# Patient Record
Sex: Male | Born: 1960 | Race: White | Hispanic: No | Marital: Married | Smoking: Former smoker
Health system: Southern US, Community
[De-identification: ages and names within clinical notes are randomized; demographics above are authoritative.]

## PROBLEM LIST (undated history)

## (undated) DIAGNOSIS — T4145XA Adverse effect of unspecified anesthetic, initial encounter: Secondary | ICD-10-CM

## (undated) DIAGNOSIS — K08109 Complete loss of teeth, unspecified cause, unspecified class: Secondary | ICD-10-CM

## (undated) DIAGNOSIS — M1990 Unspecified osteoarthritis, unspecified site: Secondary | ICD-10-CM

## (undated) DIAGNOSIS — K279 Peptic ulcer, site unspecified, unspecified as acute or chronic, without hemorrhage or perforation: Secondary | ICD-10-CM

## (undated) DIAGNOSIS — E079 Disorder of thyroid, unspecified: Secondary | ICD-10-CM

## (undated) DIAGNOSIS — K219 Gastro-esophageal reflux disease without esophagitis: Secondary | ICD-10-CM

## (undated) DIAGNOSIS — T8859XA Other complications of anesthesia, initial encounter: Secondary | ICD-10-CM

## (undated) DIAGNOSIS — Z973 Presence of spectacles and contact lenses: Secondary | ICD-10-CM

## (undated) DIAGNOSIS — R9431 Abnormal electrocardiogram [ECG] [EKG]: Secondary | ICD-10-CM

## (undated) DIAGNOSIS — R079 Chest pain, unspecified: Secondary | ICD-10-CM

## (undated) DIAGNOSIS — Z972 Presence of dental prosthetic device (complete) (partial): Secondary | ICD-10-CM

## (undated) DIAGNOSIS — S3992XA Unspecified injury of lower back, initial encounter: Secondary | ICD-10-CM

## (undated) DIAGNOSIS — G4730 Sleep apnea, unspecified: Secondary | ICD-10-CM

## (undated) HISTORY — PX: EYE SURGERY: SHX253

## (undated) HISTORY — PX: COLONOSCOPY WITH ESOPHAGOGASTRODUODENOSCOPY (EGD): SHX5779

## (undated) HISTORY — PX: TONSILLECTOMY: SUR1361

## (undated) HISTORY — PX: CARPAL TUNNEL RELEASE: SHX101

## (undated) HISTORY — DX: Gastro-esophageal reflux disease without esophagitis: K21.9

## (undated) HISTORY — DX: Sleep apnea, unspecified: G47.30

## (undated) HISTORY — DX: Unspecified injury of lower back, initial encounter: S39.92XA

## (undated) HISTORY — DX: Chest pain, unspecified: R07.9

## (undated) HISTORY — PX: JOINT REPLACEMENT: SHX530

## (undated) HISTORY — PX: UMBILICAL HERNIA REPAIR: SHX196

## (undated) HISTORY — DX: Disorder of thyroid, unspecified: E07.9

## (undated) HISTORY — PX: MULTIPLE TOOTH EXTRACTIONS: SHX2053

## (undated) HISTORY — DX: Peptic ulcer, site unspecified, unspecified as acute or chronic, without hemorrhage or perforation: K27.9

## (undated) HISTORY — PX: CYST REMOVAL NECK: SHX6281

---

## 2011-07-16 DIAGNOSIS — R079 Chest pain, unspecified: Secondary | ICD-10-CM

## 2016-04-03 ENCOUNTER — Encounter: Payer: Self-pay | Admitting: *Deleted

## 2016-04-04 ENCOUNTER — Encounter: Payer: Self-pay | Admitting: Cardiology

## 2016-04-04 ENCOUNTER — Ambulatory Visit (INDEPENDENT_AMBULATORY_CARE_PROVIDER_SITE_OTHER): Payer: BLUE CROSS/BLUE SHIELD | Admitting: Cardiology

## 2016-04-04 ENCOUNTER — Telehealth: Payer: Self-pay | Admitting: Cardiology

## 2016-04-04 ENCOUNTER — Encounter: Payer: Self-pay | Admitting: *Deleted

## 2016-04-04 VITALS — BP 111/75 | HR 72 | Ht 70.0 in | Wt 226.6 lb

## 2016-04-04 DIAGNOSIS — R079 Chest pain, unspecified: Secondary | ICD-10-CM

## 2016-04-04 LAB — PROTIME-INR

## 2016-04-04 MED ORDER — ISOSORBIDE MONONITRATE ER 30 MG PO TB24: 15.0000 mg | ORAL_TABLET | Freq: Every day | ORAL | 1 refills | Status: DC

## 2016-04-04 NOTE — Progress Notes (Signed)
Clinical Summary Mr. Taaffe is a 56 y.o.male seen as new patient, he referred by Dr Tawny Asal for the following medical problems.   1. Chest pain - episode of chest pain. Multiple episodes. Can occur at rest or with exertion. Left sided chest pressure, radiates down left arm. Lasts just a few minutes. Some increase in frequency, stable severity - admitted to [facility] 03/2016 with chest pain - Lexiscan showed inferior ischemia, intermediate risk   CAD risk factors: former x 42 years     SH [employer] is family member.  Past Medical History:  Diagnosis Date  . Chest pain   . Gastroesophageal reflux   . Lower back injury   . Peptic ulcer   . Sleep apnea   . Thyroid disease      Allergies no known allergies   Current Outpatient Prescriptions  Medication Sig Dispense Refill  . aspirin EC 81 MG tablet Take 81 mg by mouth daily.    Marland Kitchen atorvastatin (LIPITOR) 40 MG tablet Take 40 mg by mouth daily.    Marland Kitchen levothyroxine (SYNTHROID, LEVOTHROID) 50 MCG tablet Take 50 mcg by mouth daily before breakfast.    . metoprolol succinate (TOPROL-XL) 50 MG 24 hr tablet Take 50 mg by mouth daily. Take with or immediately following a meal.    . nitroGLYCERIN (NITROSTAT) 0.4 MG SL tablet Place 0.4 mg under the tongue every 5 (five) minutes as needed for chest pain.    . pantoprazole (PROTONIX) 40 MG tablet Take 40 mg by mouth daily.     No current facility-administered medications for this visit.      Past Surgical History:  Procedure Laterality Date  . CYST REMOVAL NECK       Allergies no known allergies    Family History  Problem Relation Age of Onset  . Liver cancer Mother      Social History Mr. Bohnet reports that he quit smoking about 4 years ago. His smoking use included Cigarettes. He does not have any smokeless tobacco history on file. Mr. Keelan has no alcohol history on file.   Review of Systems CONSTITUTIONAL: No weight loss, fever, chills, weakness  or fatigue.  HEENT: Eyes: No visual loss, blurred vision, double vision or yellow sclerae.No hearing loss, sneezing, congestion, runny nose or sore throat.  SKIN: No rash or itching.  CARDIOVASCULAR: per HPI RESPIRATORY: No shortness of breath, cough or sputum.  GASTROINTESTINAL: No anorexia, nausea, vomiting or diarrhea. No abdominal pain or blood.  GENITOURINARY: No burning on urination, no polyuria NEUROLOGICAL: No headache, dizziness, syncope, paralysis, ataxia, numbness or tingling in the extremities. No change in bowel or bladder control.  MUSCULOSKELETAL: No muscle, back pain, joint pain or stiffness.  LYMPHATICS: No enlarged nodes. No history of splenectomy.  PSYCHIATRIC: No history of depression or anxiety.  ENDOCRINOLOGIC: No reports of sweating, cold or heat intolerance. No polyuria or polydipsia.  Marland Kitchen   Physical Examination Vitals:   04/04/16 0823 04/04/16 0831  BP: 119/80 111/75  Pulse: 77 72   Vitals:   04/04/16 0823  Weight: 226 lb 9.6 oz (102.8 kg)  Height: 5\' 10"  (1.778 m)    Gen: resting comfortably, no acute distress HEENT: no scleral icterus, pupils equal round and reactive, no palptable cervical adenopathy,  CV: RRR, no m/r/g, no jvd Resp: Clear to auscultation bilaterally GI: abdomen is soft, non-tender, non-distended, normal bowel sounds, no hepatosplenomegaly MSK: extremities are warm, no edema.  Skin: warm, no rash Neuro:  no focal  deficits Psych: appropriate affect      Assessment and Plan  1. Chest pain - recent symptoms with abnormal lexiscan - we will plan for left heart cath to further evaluate - start imdur 15mg  daily as additional antianginal over the weekend. Counseled if significant symptoms over the weekend to present to ER.  I have reviewed the risks, indications, and alternatives to cardiac catheterization, possible angioplasty, and stenting with the patient and his wife today. Risks include but are not limited to bleeding, infection,  vascular injury, stroke, myocardial infection, arrhythmia, kidney injury, radiation-related injury in the case of prolonged fluoroscopy use, emergency cardiac surgery, and death. The patient understands the risks of serious complication is 1-2 in 6834 with diagnostic cardiac cath and 1-2% or less with angioplasty/stenting.       Arnoldo Lenis, M.D.

## 2016-04-04 NOTE — Telephone Encounter (Signed)
[facility] 04/07/16 DR KELLY @ 7:30AM

## 2016-04-04 NOTE — Patient Instructions (Signed)
Your physician recommends that you schedule a follow-up appointment in: [name]  Your physician has recommended you make the following change in your medication:   START IMDUR 15 MG (1/2 TABLET) DAILY  Your physician recommends that you return for lab work TODAY CBC/BMP/PT - [facility] [place] physician has requested that you have a cardiac catheterization. Cardiac catheterization is used to diagnose and/or treat various heart conditions. Doctors may recommend this procedure for a number of different reasons. The most common reason is to evaluate chest pain. Chest pain can be a symptom of coronary artery disease (CAD), and cardiac catheterization can show whether plaque is narrowing or blocking your heart's arteries. This procedure is also used to evaluate the valves, as well as measure the blood flow and oxygen levels in different parts of your heart. For further information please visit [email or web address]. Please follow instruction sheet, as given.  Thank you for choosing [facility]!!

## 2016-04-07 ENCOUNTER — Encounter (HOSPITAL_COMMUNITY): Payer: Self-pay | Admitting: Cardiovascular Disease

## 2016-04-07 ENCOUNTER — Other Ambulatory Visit: Payer: Self-pay

## 2016-04-07 ENCOUNTER — Ambulatory Visit (HOSPITAL_COMMUNITY)
Admission: RE | Disposition: A | Discharge status: Home / Self Care | Payer: Self-pay | Source: Ambulatory Visit | Attending: Cardiovascular Disease

## 2016-04-07 ENCOUNTER — Other Ambulatory Visit: Payer: Self-pay | Admitting: Cardiology

## 2016-04-07 ENCOUNTER — Ambulatory Visit (HOSPITAL_COMMUNITY)
Admission: RE | Admit: 2016-04-07 | Discharge: 2016-04-07 | Disposition: A | Discharge status: Home / Self Care | Payer: BLUE CROSS/BLUE SHIELD | Source: Ambulatory Visit | Attending: Cardiovascular Disease | Admitting: Cardiovascular Disease

## 2016-04-07 ENCOUNTER — Telehealth: Payer: Self-pay | Admitting: Cardiology

## 2016-04-07 DIAGNOSIS — R51 Headache: Secondary | ICD-10-CM | POA: Diagnosis not present

## 2016-04-07 DIAGNOSIS — G4730 Sleep apnea, unspecified: Secondary | ICD-10-CM | POA: Insufficient documentation

## 2016-04-07 DIAGNOSIS — Z8711 Personal history of peptic ulcer disease: Secondary | ICD-10-CM | POA: Insufficient documentation

## 2016-04-07 DIAGNOSIS — R9439 Abnormal result of other cardiovascular function study: Secondary | ICD-10-CM | POA: Diagnosis present

## 2016-04-07 DIAGNOSIS — K219 Gastro-esophageal reflux disease without esophagitis: Secondary | ICD-10-CM | POA: Diagnosis not present

## 2016-04-07 DIAGNOSIS — R0789 Other chest pain: Secondary | ICD-10-CM | POA: Diagnosis not present

## 2016-04-07 DIAGNOSIS — R079 Chest pain, unspecified: Secondary | ICD-10-CM | POA: Insufficient documentation

## 2016-04-07 DIAGNOSIS — E079 Disorder of thyroid, unspecified: Secondary | ICD-10-CM | POA: Insufficient documentation

## 2016-04-07 DIAGNOSIS — Z7982 Long term (current) use of aspirin: Secondary | ICD-10-CM | POA: Insufficient documentation

## 2016-04-07 DIAGNOSIS — Z87891 Personal history of nicotine dependence: Secondary | ICD-10-CM | POA: Diagnosis not present

## 2016-04-07 HISTORY — PX: LEFT HEART CATH AND CORONARY ANGIOGRAPHY: CATH118249

## 2016-04-07 SURGERY — LEFT HEART CATH AND CORONARY ANGIOGRAPHY
Anesthesia: LOCAL

## 2016-04-07 MED ORDER — SODIUM CHLORIDE 0.9 % WEIGHT BASED INFUSION: 1.0000 mL/kg/h | INTRAVENOUS | Status: DC

## 2016-04-07 MED ORDER — VERAPAMIL HCL 2.5 MG/ML IV SOLN
INTRAVENOUS | Status: DC | PRN
  Administered 2016-04-07: 10 mL via INTRA_ARTERIAL

## 2016-04-07 MED ORDER — SODIUM CHLORIDE 0.9% FLUSH: 3.0000 mL | INTRAVENOUS | Status: DC | PRN

## 2016-04-07 MED ORDER — FENTANYL CITRATE (PF) 100 MCG/2ML IJ SOLN
INTRAMUSCULAR | Status: AC
  Filled 2016-04-07: qty 2

## 2016-04-07 MED ORDER — HEPARIN (PORCINE) IN NACL 2-0.9 UNIT/ML-% IJ SOLN
INTRAMUSCULAR | Status: AC
  Filled 2016-04-07: qty 1000

## 2016-04-07 MED ORDER — IOPAMIDOL (ISOVUE-370) INJECTION 76%
INTRAVENOUS | Status: AC
  Filled 2016-04-07: qty 100

## 2016-04-07 MED ORDER — LIDOCAINE HCL (PF) 1 % IJ SOLN
INTRAMUSCULAR | Status: DC | PRN
  Administered 2016-04-07: 2 mL

## 2016-04-07 MED ORDER — SODIUM CHLORIDE 0.9 % IV SOLN: INTRAVENOUS | Status: AC

## 2016-04-07 MED ORDER — ONDANSETRON HCL 4 MG/2ML IJ SOLN
INTRAMUSCULAR | Status: DC | PRN
  Administered 2016-04-07: 4 mg via INTRAVENOUS

## 2016-04-07 MED ORDER — IOPAMIDOL (ISOVUE-370) INJECTION 76%
INTRAVENOUS | Status: DC | PRN
  Administered 2016-04-07: 65 mL via INTRAVENOUS

## 2016-04-07 MED ORDER — TRAMADOL HCL 50 MG PO TABS
50.0000 mg | ORAL_TABLET | Freq: Two times a day (BID) | ORAL | Status: DC | PRN
  Administered 2016-04-07: 50 mg via ORAL

## 2016-04-07 MED ORDER — LIDOCAINE HCL (PF) 1 % IJ SOLN
INTRAMUSCULAR | Status: AC
  Filled 2016-04-07: qty 30

## 2016-04-07 MED ORDER — FENTANYL CITRATE (PF) 100 MCG/2ML IJ SOLN
INTRAMUSCULAR | Status: DC | PRN
  Administered 2016-04-07 (×2): 25 ug via INTRAVENOUS

## 2016-04-07 MED ORDER — ASPIRIN 81 MG PO CHEW
81.0000 mg | CHEWABLE_TABLET | ORAL | Status: AC
  Administered 2016-04-07: 81 mg via ORAL

## 2016-04-07 MED ORDER — VERAPAMIL HCL 2.5 MG/ML IV SOLN
INTRAVENOUS | Status: AC
  Filled 2016-04-07: qty 2

## 2016-04-07 MED ORDER — ONDANSETRON HCL 4 MG/2ML IJ SOLN: 4.0000 mg | Freq: Four times a day (QID) | INTRAMUSCULAR | Status: DC | PRN

## 2016-04-07 MED ORDER — ASPIRIN 81 MG PO CHEW
CHEWABLE_TABLET | ORAL | Status: AC
  Administered 2016-04-07: 81 mg via ORAL
  Filled 2016-04-07: qty 1

## 2016-04-07 MED ORDER — TRAMADOL HCL 50 MG PO TABS
ORAL_TABLET | ORAL | Status: AC
  Administered 2016-04-07: 50 mg via ORAL
  Filled 2016-04-07: qty 1

## 2016-04-07 MED ORDER — ONDANSETRON HCL 4 MG/2ML IJ SOLN
INTRAMUSCULAR | Status: AC
  Filled 2016-04-07: qty 2

## 2016-04-07 MED ORDER — ACETAMINOPHEN 325 MG PO TABS
ORAL_TABLET | ORAL | Status: AC
  Filled 2016-04-07: qty 2

## 2016-04-07 MED ORDER — SODIUM CHLORIDE 0.9% FLUSH: 3.0000 mL | Freq: Two times a day (BID) | INTRAVENOUS | Status: DC

## 2016-04-07 MED ORDER — ACETAMINOPHEN 325 MG PO TABS
650.0000 mg | ORAL_TABLET | ORAL | Status: DC | PRN
  Administered 2016-04-07: 650 mg via ORAL

## 2016-04-07 MED ORDER — MIDAZOLAM HCL 2 MG/2ML IJ SOLN
INTRAMUSCULAR | Status: AC
  Filled 2016-04-07: qty 2

## 2016-04-07 MED ORDER — HEPARIN SODIUM (PORCINE) 1000 UNIT/ML IJ SOLN
INTRAMUSCULAR | Status: AC
  Filled 2016-04-07: qty 1

## 2016-04-07 MED ORDER — MIDAZOLAM HCL 2 MG/2ML IJ SOLN
INTRAMUSCULAR | Status: DC | PRN
  Administered 2016-04-07: 1 mg via INTRAVENOUS
  Administered 2016-04-07: 2 mg via INTRAVENOUS

## 2016-04-07 MED ORDER — SODIUM CHLORIDE 0.9 % IV SOLN: 250.0000 mL | INTRAVENOUS | Status: DC | PRN

## 2016-04-07 MED ORDER — SODIUM CHLORIDE 0.9 % WEIGHT BASED INFUSION
3.0000 mL/kg/h | INTRAVENOUS | Status: AC
  Administered 2016-04-07: 3 mL/kg/h via INTRAVENOUS

## 2016-04-07 MED ORDER — HEPARIN SODIUM (PORCINE) 1000 UNIT/ML IJ SOLN
INTRAMUSCULAR | Status: DC | PRN
  Administered 2016-04-07: 5000 [IU] via INTRAVENOUS

## 2016-04-07 SURGICAL SUPPLY — 12 items
CATH EXPO 5FR ANG PIGTAIL 145 (CATHETERS) ×2 IMPLANT
CATH EXPO 5FR FR4 (CATHETERS) ×2 IMPLANT
CATH OPTITORQUE TIG 4.0 5F (CATHETERS) ×2 IMPLANT
DEVICE RAD COMP TR BAND LRG (VASCULAR PRODUCTS) ×2 IMPLANT
GLIDESHEATH SLEND SS 6F .021 (SHEATH) ×2 IMPLANT
GUIDEWIRE INQWIRE 1.5J.035X260 (WIRE) ×1 IMPLANT
INQWIRE 1.5J .035X260CM (WIRE) ×2
KIT HEART LEFT (KITS) ×2 IMPLANT
PACK CARDIAC CATHETERIZATION (CUSTOM PROCEDURE TRAY) ×2 IMPLANT
SYR MEDRAD MARK V 150ML (SYRINGE) ×2 IMPLANT
TRANSDUCER W/STOPCOCK (MISCELLANEOUS) ×2 IMPLANT
TUBING CIL FLEX 10 FLL-RA (TUBING) ×2 IMPLANT

## 2016-04-07 NOTE — Progress Notes (Signed)
Dr Claiborne Billings notified of client c/o 9/10 headache and will give tylenol as ordered per Dr Claiborne Billings

## 2016-04-07 NOTE — Progress Notes (Signed)
C/O nausea with headache and Dr Claiborne Billings aware

## 2016-04-07 NOTE — Discharge Instructions (Signed)
Radial Site Care °Refer to this sheet in the next few weeks. These instructions provide you with information about caring for yourself after your procedure. Your health care provider may also give you more specific instructions. Your treatment has been planned according to current medical practices, but problems sometimes occur. Call your health care provider if you have any problems or questions after your procedure. °What can I expect after the procedure? °After your procedure, it is typical to have the following: °· Bruising at the radial site that usually fades within 1-2 weeks. °· Blood collecting in the tissue (hematoma) that may be painful to the touch. It should usually decrease in size and tenderness within 1-2 weeks. °Follow these instructions at home: °· Take medicines only as directed by your health care provider. °· You may shower 24-48 hours after the procedure or as directed by your health care provider. Remove the bandage (dressing) and gently wash the site with plain soap and water. Pat the area dry with a clean towel. Do not rub the site, because this may cause bleeding. °· Do not take baths, swim, or use a hot tub until your health care provider approves. °· Check your insertion site every day for redness, swelling, or drainage. °· Do not apply powder or lotion to the site. °· Do not flex or bend the affected arm for 24 hours or as directed by your health care provider. °· Do not push or pull heavy objects with the affected arm for 24 hours or as directed by your health care provider. °· Do not lift over 10 lb (4.5 kg) for 5 days after your procedure or as directed by your health care provider. °· Ask your health care provider when it is okay to: °¨ Return to work or school. °¨ Resume usual physical activities or sports. °¨ Resume sexual activity. °· Do not drive home if you are discharged the same day as the procedure. Have someone else drive you. °· You may drive 24 hours after the procedure  unless otherwise instructed by your health care provider. °· Do not operate machinery or power tools for 24 hours after the procedure. °· If your procedure was done as an outpatient procedure, which means that you went home the same day as your procedure, a responsible adult should be with you for the first 24 hours after you arrive home. °· Keep all follow-up visits as directed by your health care provider. This is important. °Contact a health care provider if: °· You have a fever. °· You have chills. °· You have increased bleeding from the radial site. Hold pressure on the site. °Get help right away if: °· You have unusual pain at the radial site. °· You have redness, warmth, or swelling at the radial site. °· You have drainage (other than a small amount of blood on the dressing) from the radial site. °· The radial site is bleeding, and the bleeding does not stop after 30 minutes of holding steady pressure on the site. °· Your arm or hand becomes pale, cool, tingly, or numb. °This information is not intended to replace advice given to you by your health care provider. Make sure you discuss any questions you have with your health care provider. °Document Released: 02/01/2010 Document Revised: 06/07/2015 Document Reviewed: 07/18/2013 °Elsevier Interactive Patient Education © 2017 [employer] ° °

## 2016-04-07 NOTE — Progress Notes (Addendum)
Patient has severe headache since on Imdur. Here as well did not improved with Tylenol x 3. Will try Tramadol. Cath showed  essentially normal coronary arteries with very minimal luminal irregularity of the LAD without obstructive disease. Discontinue Imdur.

## 2016-04-07 NOTE — Interval H&P Note (Signed)
Cath Lab Visit (complete for each Cath Lab visit)  Clinical Evaluation Leading to the Procedure:   ACS: No.  Non-ACS:    Anginal Classification: CCS III  Anti-ischemic medical therapy: Maximal Therapy (2 or more classes of medications)  Non-Invasive Test Results: Intermediate-risk stress test findings: cardiac mortality 1-3%/year  Prior CABG: No previous CABG      History and Physical Interval Note:  04/07/2016 7:50 AM  Travis Evans  has presented today for surgery, with the diagnosis of cp  The various methods of treatment have been discussed with the patient and family. After consideration of risks, benefits and other options for treatment, the patient has consented to  Procedure(s): Left Heart Cath and Coronary Angiography (N/A) as a surgical intervention .  The patient's history has been reviewed, patient examined, no change in status, stable for surgery.  I have reviewed the patient's chart and labs.  Questions were answered to the patient's satisfaction.     Shelva Majestic

## 2016-04-07 NOTE — Telephone Encounter (Signed)
Cath reviewed, looks good. Stress tests can sometimes be wrong, it happens around 10% of the time. He can stop his imdur since he is having headaches. Ok to stop cholesterol medicine at this time, though we don't actually have a cholesterol on file for him and likely we will need to readdress this in the near future. I would suggest staying on aspirin. Please request labs from pcp. He should f/u with pcp to discuss noncardiac causes of chest pain. F/u with [place] postcath in 3-4 weeks, we can discuss the rest of his medications at that time.    Zandra Abts MD

## 2016-04-07 NOTE — Telephone Encounter (Signed)
Wife is very concerned with his Cath showing no blockages and his  possibility of still having heart attack  Has questions about medications and if he really needs to be on them since Cath showed no blockages

## 2016-04-07 NOTE — Telephone Encounter (Signed)
Spoke with patients wife she was concerned with patient taking imdur due to it giving him a headache. Spoke with Dr doing the cath today and he stated patient could stop taking it but wife wanted to follow up with Dr. Harl Bowie. Also wanting to know if he could stop taking his cholesterol medicine because he has never had high cholesterol.

## 2016-04-07 NOTE — H&P (Signed)
Updated H&P: See Dr. Nelly Laurence note from 04/04/16. He is scheduled for for cath and possible PCI per Dr. Harl Bowie. He is aware of risk/benefits of procedure. Shelva Majestic, MD  7:49 AM  04/07/2016

## 2016-04-07 NOTE — Progress Notes (Signed)
Client's wife requesting to speak to Dr Claiborne Billings again and Dr Claiborne Billings notified

## 2016-04-08 MED FILL — Heparin Sodium (Porcine) 2 Unit/ML in Sodium Chloride 0.9%: INTRAMUSCULAR | Qty: 500 | Status: AC

## 2016-04-23 ENCOUNTER — Ambulatory Visit: Payer: BLUE CROSS/BLUE SHIELD | Admitting: Cardiology

## 2016-04-23 ENCOUNTER — Encounter: Payer: Self-pay | Admitting: Cardiology

## 2016-04-23 NOTE — Progress Notes (Deleted)
Clinical Summary Travis Evans is a 56 y.o.male  1. Chest pain - episode of chest pain. Multiple episodes. Can occur at rest or with exertion. Left sided chest pressure, radiates down left arm. Lasts just a few minutes. Some increase in frequency, stable severity - admitted to [facility] 03/2016 with chest pain - Lexiscan showed inferior ischemia, intermediate risk   03/2016 cath without significant CAD - he is off imdur.   2. HL?   3.   CAD risk factors: former x 42 years Past Medical History:  Diagnosis Date  . Chest pain   . Gastroesophageal reflux   . Lower back injury   . Peptic ulcer   . Sleep apnea   . Thyroid disease      No Known Allergies   Current Outpatient Prescriptions  Medication Sig Dispense Refill  . aspirin EC 81 MG tablet Take 81 mg by mouth at bedtime.     Marland Kitchen azelastine (ASTELIN) 0.1 % nasal spray Place 1 spray into both nostrils daily. Use in each nostril as directed    . cyclobenzaprine (FLEXERIL) 10 MG tablet Take 10 mg by mouth 3 (three) times daily as needed for muscle spasms.    Marland Kitchen doxycycline (VIBRAMYCIN) 100 MG capsule Take 100 mg by mouth 2 (two) times daily.    Marland Kitchen EPINEPHrine (EPIPEN 2-PAK) 0.3 mg/0.3 mL IJ SOAJ injection Inject 0.3 mg into the muscle once as needed (allergic reaction).    Marland Kitchen ibuprofen (ADVIL,MOTRIN) 200 MG tablet Take 400-600 mg by mouth every 6 (six) hours as needed for moderate pain.    Marland Kitchen levothyroxine (SYNTHROID, LEVOTHROID) 50 MCG tablet Take 50 mcg by mouth at bedtime.     Marland Kitchen loratadine (CLARITIN) 10 MG tablet Take 10 mg by mouth at bedtime.     . metoprolol succinate (TOPROL-XL) 50 MG 24 hr tablet Take 50 mg by mouth at bedtime. Take with or immediately following a meal.     . Multiple Vitamin (MULTIVITAMIN WITH MINERALS) TABS tablet Take 1 tablet by mouth at bedtime.     . nitroGLYCERIN (NITROSTAT) 0.4 MG SL tablet Place 0.4 mg under the tongue every 5 (five) minutes as needed for chest pain.    . pantoprazole  (PROTONIX) 40 MG tablet Take 40 mg by mouth at bedtime.     . vitamin C (ASCORBIC ACID) 500 MG tablet Take 500 mg by mouth 2 (two) times daily.      No current facility-administered medications for this visit.      Past Surgical History:  Procedure Laterality Date  . CYST REMOVAL NECK    . LEFT HEART CATH AND CORONARY ANGIOGRAPHY N/A 04/07/2016   Procedure: Left Heart Cath and Coronary Angiography;  Surgeon: Troy Sine, MD;  Location: [place] [facility];  Service: Cardiovascular;  Laterality: N/A;     No Known Allergies    Family History  Problem Relation Age of Onset  . Liver cancer Mother   . Stroke Father      Social History Mr. Muellner reports that he quit smoking about 4 years ago. His smoking use included Cigarettes. He smoked 3.00 packs per day. He has never used smokeless tobacco. Mr. Kant has no alcohol history on file.   Review of Systems CONSTITUTIONAL: No weight loss, fever, chills, weakness or fatigue.  HEENT: Eyes: No visual loss, blurred vision, double vision or yellow sclerae.No hearing loss, sneezing, congestion, runny nose or sore throat.  SKIN: No rash or itching.  CARDIOVASCULAR:  RESPIRATORY: No shortness of breath, cough or sputum.  GASTROINTESTINAL: No anorexia, nausea, vomiting or diarrhea. No abdominal pain or blood.  GENITOURINARY: No burning on urination, no polyuria NEUROLOGICAL: No headache, dizziness, syncope, paralysis, ataxia, numbness or tingling in the extremities. No change in bowel or bladder control.  MUSCULOSKELETAL: No muscle, back pain, joint pain or stiffness.  LYMPHATICS: No enlarged nodes. No history of splenectomy.  PSYCHIATRIC: No history of depression or anxiety.  ENDOCRINOLOGIC: No reports of sweating, cold or heat intolerance. No polyuria or polydipsia.  Marland Kitchen   Physical Examination There were no vitals filed for this visit. There were no vitals filed for this visit.  Gen: resting comfortably, no acute distress HEENT:  no scleral icterus, pupils equal round and reactive, no palptable cervical adenopathy,  CV Resp: Clear to auscultation bilaterally GI: abdomen is soft, non-tender, non-distended, normal bowel sounds, no hepatosplenomegaly MSK: extremities are warm, no edema.  Skin: warm, no rash Neuro:  no focal deficits Psych: appropriate affect   Diagnostic Studies  03/2016 cath  The left ventricular systolic function is normal.  LV end diastolic pressure is normal.   Normal LV function.  Essentially normal coronary arteries with very minimal luminal irregularity of the LAD without obstructive disease.  RECOMMENDATION: Medical therapy.  Consider evaluation for noncardiac etiology to the patient's chest pain vs small vessel disease. Suspect the nuclear study may be artifactual.    Assessment and Plan  1. Chest pain - recent symptoms with abnormal lexiscan - we will plan for left heart cath to further evaluate - start imdur 15mg  daily as additional antianginal over the weekend. Counseled if significant symptoms over the weekend to present to ER.  I have reviewed the risks, indications, and alternatives to cardiac catheterization, possible angioplasty, and stenting with the patient and his wife today. Risks include but are not limited to bleeding, infection, vascular injury, stroke, myocardial infection, arrhythmia, kidney injury, radiation-related injury in the case of prolonged fluoroscopy use, emergency cardiac surgery, and death. The patient understands the risks of serious complication is 1-2 in 1610 with diagnostic cardiac cath and 1-2% or less with angioplasty/stenting.        Arnoldo Lenis, M.D., F.A.C.C.

## 2016-06-10 ENCOUNTER — Telehealth: Payer: Self-pay | Admitting: Cardiology

## 2016-06-10 NOTE — Telephone Encounter (Signed)
Patient calling to see if clearance has been given for his surgery

## 2016-06-10 NOTE — Telephone Encounter (Signed)
Pt says having back surgery L3,4, and 5 at [place] - says they are faxing over form for Dr Harl Bowie to sign for cardiac clearance. Verified our fax number with pt

## 2016-06-12 NOTE — Telephone Encounter (Signed)
I have given signed consents to nursing who will send today   Zandra Abts MD

## 2016-06-12 NOTE — Telephone Encounter (Signed)
Pt made aware and appreciative. 

## 2016-06-12 NOTE — Telephone Encounter (Signed)
Clearance faxed to [place] office 2 days ago for Dr Harl Bowie

## 2016-06-12 NOTE — Telephone Encounter (Signed)
Pt called again regarding if ok for surgery and if ok to stop ASA

## 2016-10-22 ENCOUNTER — Other Ambulatory Visit: Payer: Self-pay | Admitting: Orthopedic Surgery

## 2016-10-22 DIAGNOSIS — M545 Low back pain, unspecified: Secondary | ICD-10-CM

## 2016-11-04 ENCOUNTER — Other Ambulatory Visit: Payer: BLUE CROSS/BLUE SHIELD

## 2016-11-07 ENCOUNTER — Ambulatory Visit
Admission: RE | Admit: 2016-11-07 | Discharge: 2016-11-07 | Disposition: A | Discharge status: Home / Self Care | Payer: BLUE CROSS/BLUE SHIELD | Source: Ambulatory Visit | Attending: Orthopedic Surgery | Admitting: Orthopedic Surgery

## 2016-11-07 DIAGNOSIS — M545 Low back pain, unspecified: Secondary | ICD-10-CM

## 2017-01-02 ENCOUNTER — Other Ambulatory Visit: Payer: Self-pay | Admitting: Orthopedic Surgery

## 2017-01-02 DIAGNOSIS — M25552 Pain in left hip: Secondary | ICD-10-CM

## 2017-01-09 ENCOUNTER — Other Ambulatory Visit: Payer: Self-pay

## 2017-01-09 ENCOUNTER — Encounter: Payer: Self-pay | Admitting: Cardiology

## 2017-01-09 ENCOUNTER — Ambulatory Visit (INDEPENDENT_AMBULATORY_CARE_PROVIDER_SITE_OTHER): Payer: BLUE CROSS/BLUE SHIELD | Admitting: Cardiology

## 2017-01-09 VITALS — BP 125/77 | HR 66 | Ht 70.0 in | Wt 231.0 lb

## 2017-01-09 DIAGNOSIS — R0789 Other chest pain: Secondary | ICD-10-CM

## 2017-01-09 DIAGNOSIS — E669 Obesity, unspecified: Secondary | ICD-10-CM

## 2017-01-09 NOTE — Patient Instructions (Signed)
Your physician recommends that you schedule a follow-up appointment in: [place]  Your physician recommends that you continue on your current medications as directed. Please refer to the Current Medication list given to you today.  You have been referred to NUTRITION   Thank you for choosing [facility]!!

## 2017-01-09 NOTE — Progress Notes (Signed)
Clinical Summary Travis Evans is a 56 y.o.male seen today for follow up of the following medical problems.    1. Chest pain  - episode of chest pain. Multiple episodes. Can occur at rest or with exertion. Left sided chest pressure, radiates down left arm. Lasts just a few minutes. Some increase in frequency, stable severity - admitted to [facility] 03/2016 with chest pain - Lexiscan showed inferior ischemia, intermediate risk  - 03/2016 cath without significant disease - denies any chest pain since last visit. No recent SOB/DOE      [place] is family member.    Past Medical History:  Diagnosis Date  . Chest pain   . Gastroesophageal reflux   . Lower back injury   . Peptic ulcer   . Sleep apnea   . Thyroid disease      No Known Allergies   Current Outpatient Medications  Medication Sig Dispense Refill  . aspirin EC 81 MG tablet Take 81 mg by mouth at bedtime.     Marland Kitchen azelastine (ASTELIN) 0.1 % nasal spray Place 1 spray into both nostrils daily. Use in each nostril as directed    . cyclobenzaprine (FLEXERIL) 10 MG tablet Take 10 mg by mouth 3 (three) times daily as needed for muscle spasms.    Marland Kitchen doxycycline (VIBRAMYCIN) 100 MG capsule Take 100 mg by mouth 2 (two) times daily.    Marland Kitchen EPINEPHrine (EPIPEN 2-PAK) 0.3 mg/0.3 mL IJ SOAJ injection Inject 0.3 mg into the muscle once as needed (allergic reaction).    Marland Kitchen ibuprofen (ADVIL,MOTRIN) 200 MG tablet Take 400-600 mg by mouth every 6 (six) hours as needed for moderate pain.    Marland Kitchen levothyroxine (SYNTHROID, LEVOTHROID) 50 MCG tablet Take 50 mcg by mouth at bedtime.     Marland Kitchen loratadine (CLARITIN) 10 MG tablet Take 10 mg by mouth at bedtime.     . metoprolol succinate (TOPROL-XL) 50 MG 24 hr tablet Take 50 mg by mouth at bedtime. Take with or immediately following a meal.     . Multiple Vitamin (MULTIVITAMIN WITH MINERALS) TABS tablet Take 1 tablet by mouth at bedtime.     . nitroGLYCERIN (NITROSTAT) 0.4 MG SL  tablet Place 0.4 mg under the tongue every 5 (five) minutes as needed for chest pain.    . pantoprazole (PROTONIX) 40 MG tablet Take 40 mg by mouth at bedtime.     . vitamin C (ASCORBIC ACID) 500 MG tablet Take 500 mg by mouth 2 (two) times daily.      No current facility-administered medications for this visit.         No Known Allergies    Family History  Problem Relation Age of Onset  . Liver cancer Mother   . Stroke Father      Social History Travis Evans reports that he quit smoking about 4 years ago. His smoking use included cigarettes. He smoked 3.00 packs per day. he has never used smokeless tobacco. Travis Evans has no alcohol history on file.   Review of Systems CONSTITUTIONAL: No weight loss, fever, chills, weakness or fatigue.  HEENT: Eyes: No visual loss, blurred vision, double vision or yellow sclerae.No hearing loss, sneezing, congestion, runny nose or sore throat.  SKIN: No rash or itching.  CARDIOVASCULAR:per hpi  RESPIRATORY: No shortness of breath, cough or sputum.  GASTROINTESTINAL: No anorexia, nausea, vomiting or diarrhea. No abdominal pain or blood.  GENITOURINARY: No burning on urination, no polyuria NEUROLOGICAL: No headache, dizziness,  syncope, paralysis, ataxia, numbness or tingling in the extremities. No change in bowel or bladder control.  MUSCULOSKELETAL: No muscle, back pain, joint pain or stiffness.  LYMPHATICS: No enlarged nodes. No history of splenectomy.  PSYCHIATRIC: No history of depression or anxiety.  ENDOCRINOLOGIC: No reports of sweating, cold or heat intolerance. No polyuria or polydipsia.  Marland Kitchen   Physical Examination Vitals:   01/09/17 1429  BP: 125/77  Pulse: 66  SpO2: 98%   Vitals:   01/09/17 1429  Weight: 231 lb (104.8 kg)  Height: 5\' 10"  (1.778 m)    Gen: resting comfortably, no acute distress HEENT: no scleral icterus, pupils equal round and reactive, no palptable cervical adenopathy,  CV: RRR, no m/r/g, no jvd Resp:  Clear to auscultation bilaterally GI: abdomen is soft, non-tender, non-distended, normal bowel sounds, no hepatosplenomegaly MSK: extremities are warm, no edema.  Skin: warm, no rash Neuro:  no focal deficits Psych: appropriate affect   Diagnostic Studies  03/2016 cath  The left ventricular systolic function is normal.  LV end diastolic pressure is normal.   Normal LV function.  Essentially normal coronary arteries with very minimal luminal irregularity of the LAD without obstructive disease.  RECOMMENDATION: Medical therapy.  Consider evaluation for noncardiac etiology to the patient's chest pain vs small vessel disease. Suspect the nuclear study may be artifactual.    Assessment and Plan   1. Chest pain - abnormal stress test but normal cath - no recent symptoms - no further cardiac testing at this time, continue risk factor modifciation.   2. Obesity -refer to nutritoin,   F/u as needed   Arnoldo Lenis, M.D.

## 2017-01-16 ENCOUNTER — Ambulatory Visit
Admission: RE | Admit: 2017-01-16 | Discharge: 2017-01-16 | Disposition: A | Discharge status: Home / Self Care | Payer: BLUE CROSS/BLUE SHIELD | Source: Ambulatory Visit | Attending: Orthopedic Surgery | Admitting: Orthopedic Surgery

## 2017-01-16 DIAGNOSIS — M25552 Pain in left hip: Secondary | ICD-10-CM

## 2017-01-16 MED ORDER — METHYLPREDNISOLONE ACETATE 40 MG/ML INJ SUSP (RADIOLOG
120.0000 mg | Freq: Once | INTRAMUSCULAR | Status: AC
  Administered 2017-01-16: 120 mg via INTRA_ARTICULAR

## 2017-01-16 MED ORDER — IOPAMIDOL (ISOVUE-M 200) INJECTION 41%
1.0000 mL | Freq: Once | INTRAMUSCULAR | Status: AC
  Administered 2017-01-16: 1 mL via INTRA_ARTICULAR

## 2017-01-16 NOTE — Discharge Instructions (Signed)

## 2017-01-17 ENCOUNTER — Encounter: Payer: Self-pay | Admitting: Cardiology

## 2017-01-20 ENCOUNTER — Other Ambulatory Visit: Payer: Self-pay | Admitting: Orthopaedic Surgery

## 2017-01-21 ENCOUNTER — Other Ambulatory Visit: Payer: BLUE CROSS/BLUE SHIELD

## 2017-01-26 ENCOUNTER — Other Ambulatory Visit: Payer: Self-pay | Admitting: Orthopaedic Surgery

## 2017-01-26 NOTE — Pre-Procedure Instructions (Signed)
    Travis Evans  01/26/2017      [id number] [place], [place] - [address] [place] [address] Phone: [phone] Fax: [phone]    Your procedure is scheduled on Tuesday, February 03, 2017  Report to [facility] Admitting at 8:15 A.M.  Call this number if you have problems the morning of surgery:  [phone]   Remember:  Do not eat food or drink liquids after midnight Monday, February 02, 2017  Take these medicines the morning of surgery with A SIP OF WATER : None Stop taking Aspirin, vitamins, fish oil and herbal medications. Do not take any NSAIDs ie: Ibuprofen, Advil, Naproxen (Aleve), Motrin, BC and Goody Powder or any medication containing Aspirin; stop now.  Do not wear jewelry, make-up or nail polish.  Do not wear lotions, powders, or perfumes, or deodorant.  Do not shave 48 hours prior to surgery.  Men may shave face and neck.  Do not bring valuables to the hospital.  [facility] is not responsible for any belongings or valuables.  Contacts, dentures or bridgework may not be worn into surgery.  Leave your suitcase in the car.  After surgery it may be brought to your room. For patients admitted to the hospital, discharge time will be determined by your treatment team. Special instructions: Shower the night before surgery and the morning of surgery with CHG. Please read over the following fact sheets that you were given. Pain Booklet, Coughing and Deep Breathing, Blood Transfusion Information, Total Joint Packet, MRSA Information and Surgical Site Infection Prevention

## 2017-01-27 ENCOUNTER — Encounter (HOSPITAL_COMMUNITY): Payer: Self-pay

## 2017-01-27 ENCOUNTER — Encounter (HOSPITAL_COMMUNITY)
Admission: RE | Admit: 2017-01-27 | Discharge: 2017-01-27 | Disposition: A | Discharge status: Home / Self Care | Payer: BLUE CROSS/BLUE SHIELD | Source: Ambulatory Visit | Attending: Orthopaedic Surgery | Admitting: Orthopaedic Surgery

## 2017-01-27 ENCOUNTER — Other Ambulatory Visit: Payer: Self-pay

## 2017-01-27 DIAGNOSIS — Z79899 Other long term (current) drug therapy: Secondary | ICD-10-CM | POA: Insufficient documentation

## 2017-01-27 DIAGNOSIS — G4733 Obstructive sleep apnea (adult) (pediatric): Secondary | ICD-10-CM | POA: Diagnosis not present

## 2017-01-27 DIAGNOSIS — Z87891 Personal history of nicotine dependence: Secondary | ICD-10-CM | POA: Diagnosis not present

## 2017-01-27 DIAGNOSIS — M1612 Unilateral primary osteoarthritis, left hip: Secondary | ICD-10-CM | POA: Diagnosis not present

## 2017-01-27 DIAGNOSIS — E079 Disorder of thyroid, unspecified: Secondary | ICD-10-CM | POA: Insufficient documentation

## 2017-01-27 DIAGNOSIS — K219 Gastro-esophageal reflux disease without esophagitis: Secondary | ICD-10-CM | POA: Insufficient documentation

## 2017-01-27 DIAGNOSIS — Z01818 Encounter for other preprocedural examination: Secondary | ICD-10-CM | POA: Diagnosis present

## 2017-01-27 HISTORY — DX: Other complications of anesthesia, initial encounter: T88.59XA

## 2017-01-27 HISTORY — DX: Presence of spectacles and contact lenses: Z97.3

## 2017-01-27 HISTORY — DX: Unspecified osteoarthritis, unspecified site: M19.90

## 2017-01-27 HISTORY — DX: Complete loss of teeth, unspecified cause, unspecified class: K08.109

## 2017-01-27 HISTORY — DX: Adverse effect of unspecified anesthetic, initial encounter: T41.45XA

## 2017-01-27 HISTORY — DX: Presence of dental prosthetic device (complete) (partial): Z97.2

## 2017-01-27 LAB — BASIC METABOLIC PANEL
ANION GAP: 9 (ref 5–15)
BUN: 10 mg/dL (ref 6–20)
CALCIUM: 9.2 mg/dL (ref 8.9–10.3)
CO2: 25 mmol/L (ref 22–32)
CREATININE: 1.05 mg/dL (ref 0.61–1.24)
Chloride: 105 mmol/L (ref 101–111)
GFR calc Af Amer: >60 mL/min (ref >60)
Glucose, Bld: 93 mg/dL (ref 65–99)
Potassium: 4 mmol/L (ref 3.5–5.1)
SODIUM: 139 mmol/L (ref 135–145)

## 2017-01-27 LAB — SURGICAL PCR SCREEN
MRSA, PCR: NEGATIVE
STAPHYLOCOCCUS AUREUS: NEGATIVE

## 2017-01-27 LAB — CBC WITH DIFFERENTIAL/PLATELET
BASOS ABS: 0 10*3/uL (ref 0.0–0.1)
BASOS PCT: 0 %
EOS ABS: 0.3 10*3/uL (ref 0.0–0.7)
Eosinophils Relative: 3 %
HEMATOCRIT: 38.8 % — AB (ref 39.0–52.0)
HEMOGLOBIN: 12.9 g/dL — AB (ref 13.0–17.0)
Lymphocytes Relative: 34 %
Lymphs Abs: 2.6 10*3/uL (ref 0.7–4.0)
MCH: 27.9 pg (ref 26.0–34.0)
MCHC: 33.2 g/dL (ref 30.0–36.0)
MCV: 84 fL (ref 78.0–100.0)
Monocytes Absolute: 0.7 10*3/uL (ref 0.1–1.0)
Monocytes Relative: 8 %
NEUTROS ABS: 4.3 10*3/uL (ref 1.7–7.7)
NEUTROS PCT: 55 %
Platelets: 305 10*3/uL (ref 150–400)
RBC: 4.62 MIL/uL (ref 4.22–5.81)
RDW: 13.9 % (ref 11.5–15.5)
WBC: 7.9 10*3/uL (ref 4.0–10.5)

## 2017-01-27 LAB — ABO/RH: ABO/RH(D): O POS

## 2017-01-27 LAB — PROTIME-INR
INR: 0.98
PROTHROMBIN TIME: 12.9 s (ref 11.4–15.2)

## 2017-01-27 LAB — URINALYSIS, ROUTINE W REFLEX MICROSCOPIC
BILIRUBIN URINE: NEGATIVE
Glucose, UA: NEGATIVE mg/dL
HGB URINE DIPSTICK: NEGATIVE
Ketones, ur: NEGATIVE mg/dL
Leukocytes, UA: NEGATIVE
Nitrite: NEGATIVE
PH: 5 (ref 5.0–8.0)
Protein, ur: NEGATIVE mg/dL
SPECIFIC GRAVITY, URINE: 1.012 (ref 1.005–1.030)

## 2017-01-27 LAB — APTT: aPTT: 37 seconds — ABNORMAL HIGH (ref 24–36)

## 2017-01-27 LAB — TYPE AND SCREEN
ABO/RH(D): O POS
ANTIBODY SCREEN: NEGATIVE

## 2017-01-27 NOTE — Progress Notes (Signed)
Pt denies SOB and chest pain. Pt under the care of Dr. Harl Bowie, Cardiology. Pt denies recent labs. Pt stated that a chest x ray was performed at [facility]; records requested. Pt satted that he does not take Aspirin. Anesthesia asked to review pt history (see cardiology note 01/09/17 in Epic).

## 2017-01-28 NOTE — Progress Notes (Signed)
Anesthesia Chart Review:  Pt is a 57 year old male scheduled for L total hip arthroplasty anterior approach on 02/03/2017 with Melrose Nakayama, MD  - Cardiologist is Carlyle Dolly, MD. Last office visit 01/09/17; prn f/u recommended.   PMH includes:  OSA, thyroid disease, GERD. Former smoker. BMI 29.5  - Hospitalized 03/2016 at [facility] for chest pain. Subsequently had cath at [facility] that was essentially normal.   Medications include: Levothyroxine, Protonix  BP 114/76   Pulse 70   Temp 36.7 C   Resp 20   Ht 5\' 10"  (1.778 m)   Wt 205 lb 9.6 oz (93.3 kg)   SpO2 96%   BMI 29.50 kg/m   Preoperative labs reviewed.   - PT normal, PTT 37.  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   Cardiac cath 04/07/16:  - Normal LV function. - Essentially normal coronary arteries with very minimal luminal irregularity of the LAD without obstructive disease.  If no changes, I anticipate pt can proceed with surgery as scheduled.   Willeen Cass, FNP-BC [facility]/Anesthesiology Phone: [phone] 01/28/2017 3:51 PM

## 2017-02-02 MED ORDER — TRANEXAMIC ACID 1000 MG/10ML IV SOLN
1000.0000 mg | INTRAVENOUS | Status: AC
  Administered 2017-02-03: 1000 mg via INTRAVENOUS
  Filled 2017-02-02: qty 10

## 2017-02-02 MED ORDER — TRANEXAMIC ACID 1000 MG/10ML IV SOLN
2000.0000 mg | INTRAVENOUS | Status: AC
  Administered 2017-02-03: 2000 mg via TOPICAL
  Filled 2017-02-02: qty 20

## 2017-02-02 NOTE — H&P (Signed)
TOTAL HIP ADMISSION H&P  Patient is admitted for left total hip arthroplasty.  Subjective:  Chief Complaint: left hip pain  HPI: Travis Evans, 57 y.o. male, has a history of pain and functional disability in the left hip(s) due to arthritis and patient has failed non-surgical conservative treatments for greater than 12 weeks to include NSAID's and/or analgesics, corticosteriod injections, flexibility and strengthening excercises, use of assistive devices, weight reduction as appropriate and activity modification.  Onset of symptoms was gradual starting 5 years ago with gradually worsening course since that time.The patient noted no past surgery on the left hip(s).  Patient currently rates pain in the left hip at 10 out of 10 with activity. Patient has night pain, worsening of pain with activity and weight bearing, trendelenberg gait, pain that interfers with activities of daily living and crepitus. Patient has evidence of subchondral cysts, subchondral sclerosis, periarticular osteophytes and joint space narrowing by imaging studies. This condition presents safety issues increasing the risk of falls.  There is no current active infection.  Patient Active Problem List   Diagnosis Date Noted  . Abnormal nuclear stress test   . Chest tightness    Past Medical History:  Diagnosis Date  . Chest pain   . Complication of anesthesia    difficulty waking  . DJD (degenerative joint disease)    left hip  . Full dentures   . Gastroesophageal reflux   . Lower back injury   . Peptic ulcer   . Sleep apnea    wears CPAP  . Thyroid disease   . Wears glasses     Past Surgical History:  Procedure Laterality Date  . COLONOSCOPY WITH ESOPHAGOGASTRODUODENOSCOPY (EGD)    . CYST REMOVAL NECK    . EYE SURGERY    . LEFT HEART CATH AND CORONARY ANGIOGRAPHY N/A 04/07/2016   Procedure: Left Heart Cath and Coronary Angiography;  Surgeon: Troy Sine, MD;  Location: [place] [facility];  Service:  Cardiovascular;  Laterality: N/A;  . MULTIPLE TOOTH EXTRACTIONS    . TONSILLECTOMY      Current Facility-Administered Medications  Medication Dose Route Frequency Provider Last Rate Last Dose  . [START ON 02/03/2017] tranexamic acid (CYKLOKAPRON) 2,000 mg in sodium chloride 0.9 % 50 mL Topical Application  6,073 mg Topical To Rexford Maus, MD       Current Outpatient Medications  Medication Sig Dispense Refill Last Dose  . azelastine (ASTELIN) 0.1 % nasal spray Place 1 spray into both nostrils at bedtime. Use in each nostril as directed    04/06/2016 at 2200  . cholecalciferol (VITAMIN D) 1000 units tablet Take 1,000 Units by mouth daily.     Marland Kitchen ibuprofen (ADVIL,MOTRIN) 200 MG tablet Take 400-600 mg by mouth every 6 (six) hours as needed for moderate pain.   Past Week at Unknown time  . levothyroxine (SYNTHROID, LEVOTHROID) 50 MCG tablet Take 50 mcg by mouth at bedtime.    04/06/2016 at 2200  . loratadine (CLARITIN) 10 MG tablet Take 10 mg by mouth at bedtime.    04/06/2016 at 2200  . pantoprazole (PROTONIX) 40 MG tablet Take 40 mg by mouth at bedtime.    04/06/2016 at 2200  . vitamin C (ASCORBIC ACID) 500 MG tablet Take 500 mg by mouth 2 (two) times daily.    04/06/2016 at 2200  . White Petrolatum-Mineral Oil (LUBRICANT EYE NIGHTTIME) OINT Place 1 application into the right eye at bedtime.     Marland Kitchen EPINEPHrine (EPIPEN 2-PAK) 0.3  mg/0.3 mL IJ SOAJ injection Inject 0.3 mg into the muscle once as needed (allergic reaction).      No Known Allergies  Social History   Tobacco Use  . Smoking status: Former Smoker    Packs/day: 3.00    Types: Cigarettes    Last attempt to quit: 04/03/2012    Years since quitting: 4.8  . Smokeless tobacco: Former Systems developer    Types: Chew  Substance Use Topics  . Alcohol use: Yes    Comment: occasional beer    Family History  Problem Relation Age of Onset  . Liver cancer Mother   . Stroke Father      Review of Systems  Musculoskeletal: Positive for joint  pain.       Left hip  All other systems reviewed and are negative.   Objective:  Physical Exam  Constitutional: He is oriented to person, place, and time. He appears well-developed and well-nourished.  HENT:  Head: Normocephalic and atraumatic.  Eyes: Pupils are equal, round, and reactive to light.  Neck: Normal range of motion.  Cardiovascular: Normal rate and regular rhythm.  Respiratory: Effort normal and breath sounds normal.  GI: Soft.  Musculoskeletal:  Left hip motion is a bit limited and somewhat painful.  His leg is slightly short compared to the opposite side.  He walks with a markedly antalgic gait.  He has a healed incision at his back with some pain on pressure at the SI joint.  Sensation is intact distally in his feet with palpable pulses on both sides.  Neurological: He is alert and oriented to person, place, and time.  Skin: Skin is warm and dry.  Psychiatric: He has a normal mood and affect. His behavior is normal. Judgment and thought content normal.    Vital signs in last 24 hours:    Labs:   Estimated body mass index is 29.5 kg/m as calculated from the following:   Height as of 01/27/17: 5\' 10"  (1.778 m).   Weight as of 01/27/17: 93.3 kg (205 lb 9.6 oz).   Imaging Review Plain radiographs demonstrate severe degenerative joint disease of the left hip(s). The bone quality appears to be good for age and reported activity level.  Assessment/Plan:  End stage primary arthritis, left hip(s)  The patient history, physical examination, clinical judgement of the provider and imaging studies are consistent with end stage degenerative joint disease of the left hip(s) and total hip arthroplasty is deemed medically necessary. The treatment options including medical management, injection therapy, arthroscopy and arthroplasty were discussed at length. The risks and benefits of total hip arthroplasty were presented and reviewed. The risks due to aseptic loosening,  infection, stiffness, dislocation/subluxation,  thromboembolic complications and other imponderables were discussed.  The patient acknowledged the explanation, agreed to proceed with the plan and consent was signed. Patient is being admitted for inpatient treatment for surgery, pain control, PT, OT, prophylactic antibiotics, VTE prophylaxis, progressive ambulation and ADL's and discharge planning.The patient is planning to be discharged home with home health services

## 2017-02-03 ENCOUNTER — Inpatient Hospital Stay (HOSPITAL_COMMUNITY)
Admission: RE | Admit: 2017-02-03 | Discharge: 2017-02-05 | DRG: 470 | Disposition: A | Discharge status: Home Health | Payer: BLUE CROSS/BLUE SHIELD | Attending: Orthopaedic Surgery | Admitting: Orthopaedic Surgery

## 2017-02-03 ENCOUNTER — Inpatient Hospital Stay (HOSPITAL_COMMUNITY): Payer: BLUE CROSS/BLUE SHIELD

## 2017-02-03 ENCOUNTER — Inpatient Hospital Stay (HOSPITAL_COMMUNITY): Payer: BLUE CROSS/BLUE SHIELD | Admitting: Emergency Medicine

## 2017-02-03 ENCOUNTER — Encounter (HOSPITAL_COMMUNITY)
Admission: RE | Disposition: A | Discharge status: Home Health | Payer: Self-pay | Source: Home / Self Care | Attending: Orthopaedic Surgery

## 2017-02-03 ENCOUNTER — Encounter (HOSPITAL_COMMUNITY): Payer: Self-pay | Admitting: *Deleted

## 2017-02-03 DIAGNOSIS — M25552 Pain in left hip: Secondary | ICD-10-CM | POA: Diagnosis present

## 2017-02-03 DIAGNOSIS — R509 Fever, unspecified: Secondary | ICD-10-CM

## 2017-02-03 DIAGNOSIS — Z87891 Personal history of nicotine dependence: Secondary | ICD-10-CM | POA: Diagnosis not present

## 2017-02-03 DIAGNOSIS — K219 Gastro-esophageal reflux disease without esophagitis: Secondary | ICD-10-CM | POA: Diagnosis present

## 2017-02-03 DIAGNOSIS — M1612 Unilateral primary osteoarthritis, left hip: Principal | ICD-10-CM | POA: Diagnosis present

## 2017-02-03 DIAGNOSIS — G4730 Sleep apnea, unspecified: Secondary | ICD-10-CM | POA: Diagnosis present

## 2017-02-03 DIAGNOSIS — J9811 Atelectasis: Secondary | ICD-10-CM | POA: Diagnosis not present

## 2017-02-03 DIAGNOSIS — Z8711 Personal history of peptic ulcer disease: Secondary | ICD-10-CM

## 2017-02-03 DIAGNOSIS — R000 Tachycardia, unspecified: Secondary | ICD-10-CM

## 2017-02-03 DIAGNOSIS — Z79899 Other long term (current) drug therapy: Secondary | ICD-10-CM | POA: Diagnosis not present

## 2017-02-03 DIAGNOSIS — Z79890 Hormone replacement therapy (postmenopausal): Secondary | ICD-10-CM

## 2017-02-03 DIAGNOSIS — E079 Disorder of thyroid, unspecified: Secondary | ICD-10-CM | POA: Diagnosis present

## 2017-02-03 DIAGNOSIS — Z419 Encounter for procedure for purposes other than remedying health state, unspecified: Secondary | ICD-10-CM

## 2017-02-03 DIAGNOSIS — Z01818 Encounter for other preprocedural examination: Secondary | ICD-10-CM

## 2017-02-03 HISTORY — PX: TOTAL HIP ARTHROPLASTY: SHX124

## 2017-02-03 LAB — APTT: APTT: 36 s (ref 24–36)

## 2017-02-03 SURGERY — Surgical Case
Anesthesia: *Unknown

## 2017-02-03 SURGERY — ARTHROPLASTY, HIP, TOTAL, ANTERIOR APPROACH
Anesthesia: Monitor Anesthesia Care | Laterality: Left

## 2017-02-03 MED ORDER — BISACODYL 5 MG PO TBEC: 5.0000 mg | DELAYED_RELEASE_TABLET | Freq: Every day | ORAL | Status: DC | PRN

## 2017-02-03 MED ORDER — BUPIVACAINE LIPOSOME 1.3 % IJ SUSP
INTRAMUSCULAR | Status: DC | PRN
  Administered 2017-02-03: 20 mL

## 2017-02-03 MED ORDER — LIDOCAINE 2% (20 MG/ML) 5 ML SYRINGE
INTRAMUSCULAR | Status: AC
  Filled 2017-02-03: qty 5

## 2017-02-03 MED ORDER — METOCLOPRAMIDE HCL 5 MG PO TABS: 5.0000 mg | ORAL_TABLET | Freq: Three times a day (TID) | ORAL | Status: DC | PRN

## 2017-02-03 MED ORDER — PROPOFOL 500 MG/50ML IV EMUL
INTRAVENOUS | Status: AC
  Filled 2017-02-03: qty 100

## 2017-02-03 MED ORDER — LIDOCAINE HCL (CARDIAC) 20 MG/ML IV SOLN
INTRAVENOUS | Status: DC | PRN
  Administered 2017-02-03: 60 mg via INTRATRACHEAL

## 2017-02-03 MED ORDER — CHLORHEXIDINE GLUCONATE 4 % EX LIQD: 60.0000 mL | Freq: Once | CUTANEOUS | Status: DC

## 2017-02-03 MED ORDER — OXYCODONE HCL 5 MG PO TABS
10.0000 mg | ORAL_TABLET | ORAL | Status: DC | PRN
  Administered 2017-02-03: 10 mg via ORAL
  Administered 2017-02-03: 5 mg via ORAL
  Administered 2017-02-04 – 2017-02-05 (×10): 10 mg via ORAL
  Filled 2017-02-03 (×11): qty 2

## 2017-02-03 MED ORDER — LACTATED RINGERS IV SOLN
INTRAVENOUS | Status: DC
  Administered 2017-02-03 (×2): via INTRAVENOUS

## 2017-02-03 MED ORDER — METOCLOPRAMIDE HCL 5 MG/ML IJ SOLN
5.0000 mg | Freq: Three times a day (TID) | INTRAMUSCULAR | Status: DC | PRN
  Administered 2017-02-03: 10 mg via INTRAVENOUS
  Filled 2017-02-03: qty 2

## 2017-02-03 MED ORDER — PROPOFOL 10 MG/ML IV BOLUS
INTRAVENOUS | Status: AC
  Filled 2017-02-03: qty 20

## 2017-02-03 MED ORDER — MIDAZOLAM HCL 5 MG/5ML IJ SOLN
INTRAMUSCULAR | Status: DC | PRN
  Administered 2017-02-03: 2 mg via INTRAVENOUS

## 2017-02-03 MED ORDER — DIPHENHYDRAMINE HCL 12.5 MG/5ML PO ELIX
12.5000 mg | ORAL_SOLUTION | ORAL | Status: DC | PRN
  Administered 2017-02-04: 25 mg via ORAL
  Filled 2017-02-03: qty 10

## 2017-02-03 MED ORDER — HYDROMORPHONE HCL 1 MG/ML IJ SOLN
INTRAMUSCULAR | Status: AC
  Filled 2017-02-03: qty 1

## 2017-02-03 MED ORDER — METHOCARBAMOL 500 MG PO TABS
500.0000 mg | ORAL_TABLET | Freq: Four times a day (QID) | ORAL | Status: DC | PRN
  Administered 2017-02-03 – 2017-02-05 (×7): 500 mg via ORAL
  Filled 2017-02-03 (×6): qty 1

## 2017-02-03 MED ORDER — HYDROMORPHONE HCL 1 MG/ML IJ SOLN
0.2500 mg | INTRAMUSCULAR | Status: DC | PRN
  Administered 2017-02-03 (×4): 0.5 mg via INTRAVENOUS

## 2017-02-03 MED ORDER — HYDROMORPHONE HCL 1 MG/ML IJ SOLN
0.5000 mg | INTRAMUSCULAR | Status: DC | PRN
  Administered 2017-02-03: 1 mg via INTRAVENOUS
  Filled 2017-02-03 (×3): qty 1

## 2017-02-03 MED ORDER — PROPOFOL 10 MG/ML IV BOLUS
INTRAVENOUS | Status: DC | PRN
  Administered 2017-02-03: 30 mg via INTRAVENOUS
  Administered 2017-02-03: 20 mg via INTRAVENOUS

## 2017-02-03 MED ORDER — EPINEPHRINE PF 1 MG/ML IJ SOLN
INTRAMUSCULAR | Status: AC
  Filled 2017-02-03: qty 1

## 2017-02-03 MED ORDER — ACETAMINOPHEN 325 MG PO TABS
650.0000 mg | ORAL_TABLET | ORAL | Status: DC | PRN
  Administered 2017-02-04 – 2017-02-05 (×4): 650 mg via ORAL
  Filled 2017-02-03 (×4): qty 2

## 2017-02-03 MED ORDER — ACETAMINOPHEN 650 MG RE SUPP: 650.0000 mg | RECTAL | Status: DC | PRN

## 2017-02-03 MED ORDER — MIDAZOLAM HCL 2 MG/2ML IJ SOLN
INTRAMUSCULAR | Status: AC
  Filled 2017-02-03: qty 2

## 2017-02-03 MED ORDER — ALUM & MAG HYDROXIDE-SIMETH 200-200-20 MG/5ML PO SUSP: 30.0000 mL | ORAL | Status: DC | PRN

## 2017-02-03 MED ORDER — ONDANSETRON HCL 4 MG/2ML IJ SOLN
INTRAMUSCULAR | Status: DC | PRN
  Administered 2017-02-03: 4 mg via INTRAVENOUS

## 2017-02-03 MED ORDER — OXYCODONE HCL 5 MG PO TABS
ORAL_TABLET | ORAL | Status: AC
  Filled 2017-02-03: qty 1

## 2017-02-03 MED ORDER — METHOCARBAMOL 1000 MG/10ML IJ SOLN
500.0000 mg | Freq: Four times a day (QID) | INTRAVENOUS | Status: DC | PRN
  Filled 2017-02-03: qty 5

## 2017-02-03 MED ORDER — PROPOFOL 1000 MG/100ML IV EMUL
INTRAVENOUS | Status: AC
  Filled 2017-02-03: qty 200

## 2017-02-03 MED ORDER — BUPIVACAINE HCL (PF) 0.25 % IJ SOLN
INTRAMUSCULAR | Status: AC
  Filled 2017-02-03: qty 30

## 2017-02-03 MED ORDER — LORATADINE 10 MG PO TABS
10.0000 mg | ORAL_TABLET | Freq: Every day | ORAL | Status: DC
  Administered 2017-02-03 – 2017-02-04 (×2): 10 mg via ORAL
  Filled 2017-02-03 (×2): qty 1

## 2017-02-03 MED ORDER — ONDANSETRON HCL 4 MG PO TABS: 4.0000 mg | ORAL_TABLET | Freq: Four times a day (QID) | ORAL | Status: DC | PRN

## 2017-02-03 MED ORDER — ONDANSETRON HCL 4 MG/2ML IJ SOLN
INTRAMUSCULAR | Status: AC
  Filled 2017-02-03: qty 2

## 2017-02-03 MED ORDER — PHENOL 1.4 % MT LIQD: 1.0000 | OROMUCOSAL | Status: DC | PRN

## 2017-02-03 MED ORDER — CEFAZOLIN SODIUM-DEXTROSE 2-4 GM/100ML-% IV SOLN
2.0000 g | Freq: Four times a day (QID) | INTRAVENOUS | Status: AC
  Administered 2017-02-03 (×2): 2 g via INTRAVENOUS
  Filled 2017-02-03 (×2): qty 100

## 2017-02-03 MED ORDER — ASPIRIN EC 325 MG PO TBEC
325.0000 mg | DELAYED_RELEASE_TABLET | Freq: Two times a day (BID) | ORAL | Status: DC
  Administered 2017-02-04 – 2017-02-05 (×3): 325 mg via ORAL
  Filled 2017-02-03 (×3): qty 1

## 2017-02-03 MED ORDER — 0.9 % SODIUM CHLORIDE (POUR BTL) OPTIME
TOPICAL | Status: DC | PRN
  Administered 2017-02-03 (×2): 1000 mL

## 2017-02-03 MED ORDER — LEVOTHYROXINE SODIUM 50 MCG PO TABS
50.0000 ug | ORAL_TABLET | Freq: Every day | ORAL | Status: DC
  Administered 2017-02-03 – 2017-02-04 (×2): 50 ug via ORAL
  Filled 2017-02-03 (×2): qty 1

## 2017-02-03 MED ORDER — MENTHOL 3 MG MT LOZG: 1.0000 | LOZENGE | OROMUCOSAL | Status: DC | PRN

## 2017-02-03 MED ORDER — LUBRICANT EYE NIGHTTIME OP OINT: 1.0000 "application " | TOPICAL_OINTMENT | Freq: Every day | OPHTHALMIC | Status: DC

## 2017-02-03 MED ORDER — PHENYLEPHRINE 40 MCG/ML (10ML) SYRINGE FOR IV PUSH (FOR BLOOD PRESSURE SUPPORT)
PREFILLED_SYRINGE | INTRAVENOUS | Status: AC
  Filled 2017-02-03: qty 10

## 2017-02-03 MED ORDER — FENTANYL CITRATE (PF) 250 MCG/5ML IJ SOLN
INTRAMUSCULAR | Status: AC
  Filled 2017-02-03: qty 5

## 2017-02-03 MED ORDER — OXYCODONE HCL 5 MG PO TABS
5.0000 mg | ORAL_TABLET | ORAL | Status: DC | PRN
  Administered 2017-02-03 (×3): 5 mg via ORAL
  Filled 2017-02-03 (×2): qty 1

## 2017-02-03 MED ORDER — FENTANYL CITRATE (PF) 250 MCG/5ML IJ SOLN
INTRAMUSCULAR | Status: AC
Start: 2017-02-03 — End: 2017-02-03
  Filled 2017-02-03: qty 5

## 2017-02-03 MED ORDER — PROPOFOL 500 MG/50ML IV EMUL
INTRAVENOUS | Status: DC | PRN
  Administered 2017-02-03: 50 ug/kg/min via INTRAVENOUS

## 2017-02-03 MED ORDER — DOCUSATE SODIUM 100 MG PO CAPS
100.0000 mg | ORAL_CAPSULE | Freq: Two times a day (BID) | ORAL | Status: DC
  Administered 2017-02-03 – 2017-02-05 (×3): 100 mg via ORAL
  Filled 2017-02-03 (×4): qty 1

## 2017-02-03 MED ORDER — CEFAZOLIN SODIUM-DEXTROSE 2-4 GM/100ML-% IV SOLN
2.0000 g | INTRAVENOUS | Status: AC
  Administered 2017-02-03: 2 g via INTRAVENOUS
  Filled 2017-02-03: qty 100

## 2017-02-03 MED ORDER — BUPIVACAINE IN DEXTROSE 0.75-8.25 % IT SOLN
INTRATHECAL | Status: DC | PRN
  Administered 2017-02-03: 15 mg via INTRATHECAL

## 2017-02-03 MED ORDER — AZELASTINE HCL 0.1 % NA SOLN
1.0000 | Freq: Every day | NASAL | Status: DC
  Filled 2017-02-03: qty 30

## 2017-02-03 MED ORDER — PANTOPRAZOLE SODIUM 40 MG PO TBEC
40.0000 mg | DELAYED_RELEASE_TABLET | Freq: Every day | ORAL | Status: DC
  Administered 2017-02-03 – 2017-02-04 (×2): 40 mg via ORAL
  Filled 2017-02-03 (×2): qty 1

## 2017-02-03 MED ORDER — METHOCARBAMOL 500 MG PO TABS
ORAL_TABLET | ORAL | Status: AC
  Filled 2017-02-03: qty 1

## 2017-02-03 MED ORDER — ARTIFICIAL TEARS OPHTHALMIC OINT
1.0000 "application " | TOPICAL_OINTMENT | Freq: Every day | OPHTHALMIC | Status: DC
  Filled 2017-02-03: qty 3.5

## 2017-02-03 MED ORDER — BUPIVACAINE HCL (PF) 0.25 % IJ SOLN
INTRAMUSCULAR | Status: DC | PRN
  Administered 2017-02-03: 30 mL

## 2017-02-03 MED ORDER — TRANEXAMIC ACID 1000 MG/10ML IV SOLN
1000.0000 mg | Freq: Once | INTRAVENOUS | Status: AC
  Administered 2017-02-03: 1000 mg via INTRAVENOUS
  Filled 2017-02-03: qty 10

## 2017-02-03 MED ORDER — LACTATED RINGERS IV SOLN
INTRAVENOUS | Status: DC
  Administered 2017-02-03 (×2): via INTRAVENOUS

## 2017-02-03 MED ORDER — ONDANSETRON HCL 4 MG/2ML IJ SOLN
4.0000 mg | Freq: Four times a day (QID) | INTRAMUSCULAR | Status: DC | PRN
  Administered 2017-02-03: 4 mg via INTRAVENOUS
  Filled 2017-02-03: qty 2

## 2017-02-03 MED ORDER — BUPIVACAINE LIPOSOME 1.3 % IJ SUSP
20.0000 mL | Freq: Once | INTRAMUSCULAR | Status: DC
  Filled 2017-02-03: qty 20

## 2017-02-03 MED ORDER — HYDROMORPHONE HCL 1 MG/ML IJ SOLN
0.5000 mg | INTRAMUSCULAR | Status: DC | PRN
  Administered 2017-02-03: 0.5 mg via INTRAVENOUS

## 2017-02-03 MED ORDER — FENTANYL CITRATE (PF) 100 MCG/2ML IJ SOLN
INTRAMUSCULAR | Status: DC | PRN
  Administered 2017-02-03 (×4): 25 ug via INTRAVENOUS
  Administered 2017-02-03 (×3): 50 ug via INTRAVENOUS

## 2017-02-03 MED ORDER — EPINEPHRINE PF 1 MG/ML IJ SOLN
INTRAMUSCULAR | Status: DC | PRN
  Administered 2017-02-03: .15 mL

## 2017-02-03 SURGICAL SUPPLY — 44 items
BLADE SAW SGTL 18X1.27X75 (BLADE) ×2 IMPLANT
BLADE SAW SGTL 18X1.27X75MM (BLADE) ×1
CAPT HIP TOTAL 2 ×3 IMPLANT
CELLS DAT CNTRL 66122 CELL SVR (MISCELLANEOUS) ×1 IMPLANT
CLOSURE WOUND 1/2 X4 (GAUZE/BANDAGES/DRESSINGS) ×1
COVER PERINEAL POST (MISCELLANEOUS) ×3 IMPLANT
COVER SURGICAL LIGHT HANDLE (MISCELLANEOUS) ×3 IMPLANT
DRAPE C-ARM 42X72 X-RAY (DRAPES) ×3 IMPLANT
DRAPE STERI IOBAN 125X83 (DRAPES) ×3 IMPLANT
DRAPE U-SHAPE 47X51 STRL (DRAPES) ×9 IMPLANT
DRSG AQUACEL AG ADV 3.5X10 (GAUZE/BANDAGES/DRESSINGS) ×3 IMPLANT
DURAPREP 26ML APPLICATOR (WOUND CARE) ×3 IMPLANT
ELECT BLADE 4.0 EZ CLEAN MEGAD (MISCELLANEOUS) ×3
ELECT REM PT RETURN 9FT ADLT (ELECTROSURGICAL) ×3
ELECTRODE BLDE 4.0 EZ CLN MEGD (MISCELLANEOUS) ×1 IMPLANT
ELECTRODE REM PT RTRN 9FT ADLT (ELECTROSURGICAL) ×1 IMPLANT
FACESHIELD WRAPAROUND (MASK) ×6 IMPLANT
GLOVE BIO SURGEON STRL SZ8 (GLOVE) ×6 IMPLANT
GLOVE BIOGEL PI IND STRL 8 (GLOVE) ×2 IMPLANT
GLOVE BIOGEL PI INDICATOR 8 (GLOVE) ×4
GOWN STRL REUS W/ TWL LRG LVL3 (GOWN DISPOSABLE) ×1 IMPLANT
GOWN STRL REUS W/ TWL XL LVL3 (GOWN DISPOSABLE) ×2 IMPLANT
GOWN STRL REUS W/TWL LRG LVL3 (GOWN DISPOSABLE) ×2
GOWN STRL REUS W/TWL XL LVL3 (GOWN DISPOSABLE) ×4
KIT BASIN OR (CUSTOM PROCEDURE TRAY) ×3 IMPLANT
KIT ROOM TURNOVER OR (KITS) ×3 IMPLANT
MANIFOLD NEPTUNE II (INSTRUMENTS) ×3 IMPLANT
NEEDLE ECHOTIP HI DEF 22GA (NEEDLE) ×3 IMPLANT
NEEDLE HYPO 22GX1.5 SAFETY (NEEDLE) ×3 IMPLANT
NS IRRIG 1000ML POUR BTL (IV SOLUTION) ×6 IMPLANT
PACK TOTAL JOINT (CUSTOM PROCEDURE TRAY) ×3 IMPLANT
PAD ARMBOARD 7.5X6 YLW CONV (MISCELLANEOUS) ×3 IMPLANT
RTRCTR WOUND ALEXIS 18CM MED (MISCELLANEOUS) ×3
STRIP CLOSURE SKIN 1/2X4 (GAUZE/BANDAGES/DRESSINGS) ×2 IMPLANT
SUT ETHIBOND NAB CT1 #1 30IN (SUTURE) ×6 IMPLANT
SUT VIC AB 1 CT1 27 (SUTURE) ×2
SUT VIC AB 1 CT1 27XBRD ANBCTR (SUTURE) ×1 IMPLANT
SUT VIC AB 2-0 CT1 27 (SUTURE) ×2
SUT VIC AB 2-0 CT1 TAPERPNT 27 (SUTURE) ×1 IMPLANT
SUT VIC AB 3-0 PS2 18 (SUTURE) ×2
SUT VIC AB 3-0 PS2 18XBRD (SUTURE) ×1 IMPLANT
SUT VIC AB 3-0 X1 27 (SUTURE) ×3 IMPLANT
SUT VLOC 180 0 24IN GS25 (SUTURE) ×3 IMPLANT
SYR 50ML LL SCALE MARK (SYRINGE) ×3 IMPLANT

## 2017-02-03 NOTE — Evaluation (Signed)
Physical Therapy Evaluation Patient Details Name: Travis Evans MRN: [id number] DOB: [birth year] Today's Date: 02/03/2017   History of Present Illness  Pt is a 57 y/o male s/p elective L THA, direct anterior approach. PMH includes OSA on CPAP, s/p L heart cath.   Clinical Impression  Pt s/p surgery above with deficits below. Mobility limited to chair this session as pt with increased nausea and pain. Required min to min guard assist throughout. HEP initiated as well, however, limited tolerance secondary to pain. RN notified about nausea and gave meds during session. Will continue to follow acutely to maximize functional mobility independence and safety.     Follow Up Recommendations DC plan and follow up therapy as arranged by surgeon;Supervision for mobility/OOB    Equipment Recommendations  Rolling walker with 5" wheels    Recommendations for Other Services       Precautions / Restrictions Precautions Precautions: None Precaution Comments: Reviewed supine ther ex with pt. Very limited secondary to pain.  Restrictions Weight Bearing Restrictions: Yes LLE Weight Bearing: Weight bearing as tolerated      Mobility  Bed Mobility Overal bed mobility: Needs Assistance Bed Mobility: Supine to Sit     Supine to sit: Min assist     General bed mobility comments: Min A for LLE management. Verbal cues for technique and required use of Bed rails. Pt required extended seated rest in sitting secondary to episode of nausea. RN notified and gave meds at end of session.   Transfers Overall transfer level: Needs assistance Equipment used: Rolling walker (2 wheeled) Transfers: Sit to/from [facility] Sit to Stand: Min assist;From elevated surface Stand pivot transfers: Min guard       General transfer comment: Min A for lift assist and steadying from elevated surface. Min guard for safety during transfer. Required cues for sequencing and extended time to  complete.  Ambulation/Gait             General Gait Details: NT secondary to nausea/pain   Stairs            Wheelchair Mobility    Modified Rankin (Stroke Patients Only)       Balance Overall balance assessment: Needs assistance Sitting-balance support: No upper extremity supported;Feet supported Sitting balance-Leahy Scale: Fair     Standing balance support: Bilateral upper extremity supported;During functional activity Standing balance-Leahy Scale: Poor Standing balance comment: Reliant on BUE support.                              Pertinent Vitals/Pain Pain Assessment: Faces Faces Pain Scale: Hurts whole lot Pain Location: L hip  Pain Descriptors / Indicators: Aching;Operative site guarding;Grimacing Pain Intervention(s): Monitored during session;Limited activity within patient's tolerance;Repositioned;Premedicated before session    [place] expects to be discharged to:: Private residence Living Arrangements: Spouse/significant other;Children Available Help at Discharge: Family;Available 24 hours/day Type of Home: House Home Access: Stairs to enter Entrance Stairs-Rails: None Entrance Stairs-Number of Steps: 2 Home Layout: One level Home Equipment: Bedside commode      Prior Function Level of Independence: Independent with assistive device(s)         Comments: Reports using walking stick secondary to pain.      Hand Dominance        Extremity/Trunk Assessment   Upper Extremity Assessment Upper Extremity Assessment: Overall WFL for tasks assessed    Lower Extremity Assessment Lower Extremity Assessment: LLE deficits/detail LLE Deficits / Details:  Sensory in tact. Deficits consistent with post op pain and weakness. Able to perform ther ex below.     Cervical / Trunk Assessment Cervical / Trunk Assessment: Normal  Communication   Communication: No difficulties  Cognition Arousal/Alertness:  Awake/alert Behavior During Therapy: WFL for tasks assessed/performed Overall Cognitive Status: Within Functional Limits for tasks assessed                                        General Comments General comments (skin integrity, edema, etc.): At end of session, pt nauseous and in pain. Required extended time at end of session for positioning. Wife present in room.     Exercises Total Joint Exercises Ankle Circles/Pumps: AROM;Both;20 reps Quad Sets: AROM;Left;10 reps   Assessment/Plan    PT Assessment Patient needs continued PT services  PT Problem List Decreased strength;Decreased range of motion;Decreased activity tolerance;Decreased balance;Decreased mobility;Decreased knowledge of use of DME;Decreased knowledge of precautions;Pain       PT Treatment Interventions DME instruction;Stair training;Functional mobility training;Gait training;Therapeutic exercise;Therapeutic activities;Neuromuscular re-education;Balance training;Patient/family education    PT Goals (Current goals can be found in the Care Plan section)  Acute Rehab PT Goals Patient Stated Goal: to feel better  PT Goal Formulation: With patient Time For Goal Achievement: 02/17/17 Potential to Achieve Goals: Good    Frequency 7X/week   Barriers to discharge        Co-evaluation               AM-PAC PT "6 Clicks" Daily Activity  Outcome Measure Difficulty turning over in bed (including adjusting bedclothes, sheets and blankets)?: A Little Difficulty moving from lying on back to sitting on the side of the bed? : Unable Difficulty sitting down on and standing up from a chair with arms (e.g., wheelchair, bedside commode, etc,.)?: Unable Help needed moving to and from a bed to chair (including a wheelchair)?: A Little Help needed walking in hospital room?: A Lot Help needed climbing 3-5 steps with a railing? : A Lot 6 Click Score: 12    End of Session Equipment Utilized During Treatment:  Gait belt Activity Tolerance: Patient limited by pain;Treatment limited secondary to medical complications (Comment)(nausea ) Patient left: in chair;with call bell/phone within reach;with nursing/sitter in room;with family/visitor present Nurse Communication: Mobility status;Other (comment)(requesting nausea meds ) PT Visit Diagnosis: Other abnormalities of gait and mobility (R26.89);Pain Pain - Right/Left: Left Pain - part of body: Hip    Time: 3007-6226 PT Time Calculation (min) (ACUTE ONLY): 56 min   Charges:   PT Evaluation $PT Eval Moderate Complexity: 1 Mod PT Treatments $Therapeutic Activity: 23-37 mins   PT G Codes:        Leighton Ruff, PT, DPT  Acute Rehabilitation Services  Pager: [phone]   Rudean Hitt 02/03/2017, 7:01 PM

## 2017-02-03 NOTE — Progress Notes (Signed)
Patient arrived to 6n28 alert and oriented, mild pain will medicate per orders. IV fluids infusing, scds on, VSS on 2L oxygen. Patient noted to have a hydrocolliod dressing on left upper thigh clean dry and intact, with ice pack on top. Wife at bedside, oriented to room and staff, will continue to monitor

## 2017-02-03 NOTE — Transfer of Care (Signed)
Immediate Anesthesia Transfer of Care Note  Patient: Travis Evans  Procedure(s) Performed: TOTAL HIP ARTHROPLASTY ANTERIOR APPROACH (Left )  Patient Location: PACU  Anesthesia Type:General and Spinal  Level of Consciousness: awake, alert , oriented and sedated  Airway & Oxygen Therapy: Patient Spontanous Breathing and Patient connected to face mask oxygen  Post-op Assessment: Report given to RN, Post -op Vital signs reviewed and stable and Patient moving all extremities  Post vital signs: Reviewed and stable  Last Vitals:  Vitals:   02/03/17 0802  BP: 131/75  Pulse: 75  Resp: 20  Temp: 36.5 C  SpO2: 99%    Last Pain:  Vitals:   02/03/17 0853  TempSrc:   PainSc: 8       Patients Stated Pain Goal: 6 (83/25/49 8264)  Complications: No apparent anesthesia complications

## 2017-02-03 NOTE — Progress Notes (Signed)
RT set up patient CPAP. NO 2 bleed in needed. Patient has home mask so he is able to place mask on himself

## 2017-02-03 NOTE — Anesthesia Procedure Notes (Signed)
Spinal  Patient location during procedure: OR Start time: 02/03/2017 9:47 AM End time: 02/03/2017 9:56 AM Staffing Anesthesiologist: Duane Boston, MD Performed: anesthesiologist  Preanesthetic Checklist Completed: patient identified, surgical consent, pre-op evaluation, timeout performed, IV checked, risks and benefits discussed and monitors and equipment checked Spinal Block Patient position: sitting Prep: DuraPrep Patient monitoring: cardiac monitor, continuous pulse ox and blood pressure Approach: midline Location: L2-3 Injection technique: single-shot Needle Needle type: Pencan  Needle gauge: 24 G Needle length: 9 cm Additional Notes Functioning IV was confirmed and monitors were applied. Sterile prep and drape, including hand hygiene and sterile gloves were used. The patient was positioned and the spine was prepped. The skin was anesthetized with lidocaine.  Free flow of clear CSF was obtained prior to injecting local anesthetic into the CSF.  The spinal needle aspirated freely following injection.  The needle was carefully withdrawn.  The patient tolerated the procedure well.

## 2017-02-03 NOTE — Interval H&P Note (Signed)
History and Physical Interval Note:  02/03/2017 9:01 AM  Travis Evans  has presented today for surgery, with the diagnosis of LEFT HIP DEGENERATIVE JOINT DISEASE  The various methods of treatment have been discussed with the patient and family. After consideration of risks, benefits and other options for treatment, the patient has consented to  Procedure(s): TOTAL HIP ARTHROPLASTY ANTERIOR APPROACH (Left) as a surgical intervention .  The patient's history has been reviewed, patient examined, no change in status, stable for surgery.  I have reviewed the patient's chart and labs.  Questions were answered to the patient's satisfaction.     [name]

## 2017-02-03 NOTE — Anesthesia Procedure Notes (Signed)
Procedure Name: LMA Insertion Date/Time: 02/03/2017 10:59 AM Performed by: Scheryl Darter, CRNA Pre-anesthesia Checklist: Patient identified, Emergency Drugs available, Suction available, Patient being monitored and Timeout performed Oxygen Delivery Method: Circle system utilized Preoxygenation: Pre-oxygenation with 100% oxygen LMA Size: 5.0 Placement Confirmation: positive ETCO2 Tube secured with: Tape

## 2017-02-03 NOTE — Anesthesia Preprocedure Evaluation (Addendum)
Anesthesia Evaluation  Patient identified by MRN, date of birth, ID band Patient awake    Reviewed: Allergy & Precautions, NPO status , Patient's Chart, lab work & pertinent test results  History of Anesthesia Complications Negative for: history of anesthetic complications  Airway Mallampati: II  TM Distance: >3 FB Neck ROM: Full    Dental no notable dental hx. (+) Dental Advisory Given   Pulmonary sleep apnea and Continuous Positive Airway Pressure Ventilation , former smoker,    Pulmonary exam normal        Cardiovascular + CAD  negative cardio ROS Normal cardiovascular exam  Conclusion     The left ventricular systolic function is normal.  LV end diastolic pressure is normal.   Normal LV function.  Essentially normal coronary arteries with very minimal luminal irregularity of the LAD without obstructive disease.  RECOMMENDATION: Medical therapy.  Consider evaluation for noncardiac etiology to the patient's chest pain vs small vessel disease. Suspect the nuclear study may be artifactual.       Neuro/Psych negative neurological ROS  negative psych ROS   GI/Hepatic Neg liver ROS, PUD, GERD  ,  Endo/Other  negative endocrine ROS  Renal/GU negative Renal ROS     Musculoskeletal   Abdominal   Peds  Hematology negative hematology ROS (+)   Anesthesia Other Findings   Reproductive/Obstetrics                             Anesthesia Physical Anesthesia Plan  ASA: II  Anesthesia Plan: MAC and Spinal   Post-op Pain Management:    Induction:   PONV Risk Score and Plan: 2 and Ondansetron and Propofol infusion  Airway Management Planned: Natural Airway and Simple Face Mask  Additional Equipment:   Intra-op Plan:   Post-operative Plan:   Informed Consent: I have reviewed the patients History and Physical, chart, labs and discussed the procedure including the risks,  benefits and alternatives for the proposed anesthesia with the patient or authorized representative who has indicated his/her understanding and acceptance.   Dental advisory given  Plan Discussed with: CRNA, Anesthesiologist and Surgeon  Anesthesia Plan Comments:        Anesthesia Quick Evaluation

## 2017-02-03 NOTE — Op Note (Signed)

## 2017-02-03 NOTE — Progress Notes (Signed)
Orthopedic Tech Progress Note Patient Details:  Travis Evans 1960-07-30 [id number] OHF applied  Patient ID: RY [name], male   DOB: 1960-02-04, 57 y.o.   MRN: [id number]   Kristopher Oppenheim 02/03/2017, 5:04 PM

## 2017-02-03 NOTE — Progress Notes (Signed)
Pt c/o 8-9/10 post op pain. VSS, he is awake & alert, also, c/o shivering. Dr A. Hodierne MDA now, fully updated. New order for additional 1mg  IV Dilaudid. Will cont to monitor closely.

## 2017-02-03 NOTE — Anesthesia Postprocedure Evaluation (Signed)
Anesthesia Post Note  Patient: Travis Evans  Procedure(s) Performed: TOTAL HIP ARTHROPLASTY ANTERIOR APPROACH (Left )     Patient location during evaluation: PACU Anesthesia Type: MAC and Spinal Level of consciousness: awake and alert Pain management: pain level controlled Vital Signs Assessment: post-procedure vital signs reviewed and stable Respiratory status: spontaneous breathing and respiratory function stable Cardiovascular status: blood pressure returned to baseline and stable Postop Assessment: spinal receding Anesthetic complications: no    Last Vitals:  Vitals:   02/03/17 1245 02/03/17 1259  BP: 127/71   Pulse: (!) 56 71  Resp: 14 16  Temp:    SpO2: 98% 94%    Last Pain:  Vitals:   02/03/17 1259  TempSrc:   PainSc: 8                  Arnett Duddy DANIEL

## 2017-02-04 ENCOUNTER — Other Ambulatory Visit: Payer: Self-pay

## 2017-02-04 ENCOUNTER — Inpatient Hospital Stay (HOSPITAL_COMMUNITY): Payer: BLUE CROSS/BLUE SHIELD

## 2017-02-04 ENCOUNTER — Encounter (HOSPITAL_COMMUNITY): Payer: Self-pay | Admitting: General Practice

## 2017-02-04 NOTE — Progress Notes (Addendum)
Patient has temperature 102.7 and heart rate 118. Notified Gaspar Skeeters PA via telephone; Modena Slater stated to give patient PRN tylenol and order for STAT CXR, as well as CBC w/ diff and BMP in AM.

## 2017-02-04 NOTE — Care Management Note (Signed)
Case Management Note  Patient Details  Name: RYANE CANAVAN MRN: [id number] Date of Birth: 1960-12-15  Subjective/Objective:                    Action/Plan:  Patient requesting [place] [phone] and [facility]PT [facility]. Called same and made request for [facility]PT [facility]. Faxed referral to [phone]  Called Neoma Laming with [facility] and ordered 3 in 1 and walker. Expected Discharge Date:                  Expected Discharge Plan:  [place]  In-House Referral:     Discharge planning Services  CM Consult  Post Acute Care Choice:  Durable Medical Equipment, Home Health Choice offered to:  Patient, Spouse, Adult Children  DME Arranged:  Walker rolling DME Agency:  [place]:  PT [place] Agency:  Other - See comment  Status of Service:  Completed, signed off  If discussed at [place] of Stay Meetings, dates discussed:    Additional Comments:  Marilu Favre, RN 02/04/2017, 1:07 PM

## 2017-02-04 NOTE — Progress Notes (Signed)
Subjective: 1 Day Post-Op Procedure(s) (LRB): TOTAL HIP ARTHROPLASTY ANTERIOR APPROACH (Left)  Activity level:  wbat Diet tolerance:  ok Voiding:  ok Patient reports pain as mild and moderate.    Objective: Vital signs in last 24 hours: Temp:  [97.7 F (36.5 C)-100.4 F (38 C)] 98.6 F (37 C) (01/23 0535) Pulse Rate:  [55-106] 98 (01/23 0535) Resp:  [10-20] 16 (01/23 0535) BP: (108-152)/(64-86) 124/64 (01/23 0535) SpO2:  [93 %-100 %] 98 % (01/23 0535) Weight:  [93 kg (205 lb)-99.8 kg (220 lb)] 99.8 kg (220 lb) (01/22 1700)  Labs: No results for input(s): HGB in the last 72 hours. No results for input(s): WBC, RBC, HCT, PLT in the last 72 hours. No results for input(s): NA, K, CL, CO2, BUN, CREATININE, GLUCOSE, CALCIUM in the last 72 hours. No results for input(s): LABPT, INR in the last 72 hours.  Physical Exam:  Neurologically intact ABD soft Neurovascular intact Sensation intact distally Intact pulses distally Dorsiflexion/Plantar flexion intact Incision: dressing C/D/I and no drainage No cellulitis present Compartment soft  Assessment/Plan:  1 Day Post-Op Procedure(s) (LRB): TOTAL HIP ARTHROPLASTY ANTERIOR APPROACH (Left) Advance diet Up with therapy Plan for discharge tomorrow Discharge home with home health if doing well and cleared by PT. Continue on ASA 325mg  BID x 4 weeks post op. Follow up in office 2 weeks post op.  Travis Evans, Travis Evans 02/04/2017, 7:28 AM

## 2017-02-04 NOTE — Progress Notes (Signed)
Physical Therapy Treatment Patient Details Name: Travis Evans MRN: [id number] DOB: 1960-06-11 Today's Date: 02/04/2017    History of Present Illness Pt is a 57 y/o male s/p elective L THA, direct anterior approach. PMH includes OSA on CPAP, s/p L heart cath.     PT Comments    Pt is POD #1 and is moving better than yesterday and does not have any nausea.  He was able to walk a loop in the hallway with RW and min guard assist.  HEP program reviewed and ice applied at the end of our session.  PT will return in PM for second session.    Follow Up Recommendations  DC plan and follow up therapy as arranged by surgeon;Supervision for mobility/OOB     Equipment Recommendations  Rolling walker with 5" wheels    Recommendations for Other Services   NA     Precautions / Restrictions Precautions Precautions: None Restrictions Weight Bearing Restrictions: Yes LLE Weight Bearing: Weight bearing as tolerated    Mobility  Bed Mobility Overal bed mobility: Needs Assistance Bed Mobility: Supine to Sit     Supine to sit: Min assist     General bed mobility comments: Min assist to support left leg to help move it over EOB and then separately to give pt hand held assist to pull up to sitting EOB.  Pt came out of the left side of the bed with it elevated quite high to simulate his bed at home.   Transfers Overall transfer level: Needs assistance Equipment used: Rolling walker (2 wheeled) Transfers: Sit to/from Stand Sit to Stand: Min guard         General transfer comment: Min guard assist for safety during transitions.   Ambulation/Gait Ambulation/Gait assistance: Min guard Ambulation Distance (Feet): 130 Feet Assistive device: Rolling walker (2 wheeled) Gait Pattern/deviations: Step-to pattern;Antalgic;Trunk flexed Gait velocity: decreased Gait velocity interpretation: Below normal speed for age/gender General Gait Details: Verbal cues for correct LE sequencing, upright  posture and foot flat on the left (he wanted to walk on his toes on this side).  Verbal cues to remind him of WBAT status on this side.           Balance Overall balance assessment: Needs assistance Sitting-balance support: Feet supported;No upper extremity supported Sitting balance-Leahy Scale: Good     Standing balance support: Bilateral upper extremity supported Standing balance-Leahy Scale: Fair                              Cognition Arousal/Alertness: Awake/alert Behavior During Therapy: WFL for tasks assessed/performed Overall Cognitive Status: Within Functional Limits for tasks assessed                                        Exercises Total Joint Exercises Ankle Circles/Pumps: AROM;Both;20 reps Quad Sets: AROM;Left;10 reps Heel Slides: AAROM;Left;10 reps Hip ABduction/ADduction: AAROM;Left;10 reps Long Arc Quad: AROM;Left;10 reps General Exercises - Lower Extremity Mini-Sqauts: AROM;Both;10 reps;Other (comment)(with RW for support)    General Comments General comments (skin integrity, edema, etc.): Pt running a fever today, so reviewed IS use x 10 reps, pt able to get >2500 mL      Pertinent Vitals/Pain Pain Assessment: 0-10 Pain Score: 8  Pain Location: left hip Pain Descriptors / Indicators: Aching;Burning Pain Intervention(s): Limited activity within patient's tolerance;Monitored during session;Repositioned;Ice applied  Home Living Family/patient expects to be discharged to:: Private residence Living Arrangements: Spouse/significant other                      PT Goals (current goals can now be found in the care plan section) Acute Rehab PT Goals Patient Stated Goal: to feel better  Progress towards PT goals: Progressing toward goals    Frequency    7X/week      PT Plan Current plan remains appropriate       AM-PAC PT "6 Clicks" Daily Activity  Outcome Measure  Difficulty turning over in bed (including  adjusting bedclothes, sheets and blankets)?: Unable Difficulty moving from lying on back to sitting on the side of the bed? : Unable Difficulty sitting down on and standing up from a chair with arms (e.g., wheelchair, bedside commode, etc,.)?: Unable Help needed moving to and from a bed to chair (including a wheelchair)?: A Little Help needed walking in hospital room?: A Little Help needed climbing 3-5 steps with a railing? : A Little 6 Click Score: 12    End of Session   Activity Tolerance: Patient limited by pain Patient left: in chair;with call bell/phone within reach;with family/visitor present   PT Visit Diagnosis: Other abnormalities of gait and mobility (R26.89);Pain Pain - Right/Left: Left Pain - part of body: Hip     Time: 3291-9166 PT Time Calculation (min) (ACUTE ONLY): 33 min  Charges:  $Gait Training: 8-22 mins $Therapeutic Exercise: 8-22 mins          Magon Croson B. [place], [place], DPT [phone]            02/04/2017, 12:25 PM

## 2017-02-04 NOTE — Progress Notes (Signed)
Physical Therapy Treatment Patient Details Name: Travis Evans MRN: [id number] DOB: 26-Feb-1960 Today's Date: 02/04/2017    History of Present Illness Pt is a 57 y/o male s/p elective L THA, direct anterior approach. PMH includes OSA on CPAP, s/p L heart cath.     PT Comments    Pt is POD #1 and this is his second session.  He did not go quite as far as this AM, but was a bit more sitff and sore (was up for several hours in the recliner chair).  He remains min to min guard assist overall and has progressed nicely with his exercises.  I anticipate he could likely go home after his AM therapy session tomorrow from a mobility stand point.    Follow Up Recommendations  DC plan and follow up therapy as arranged by surgeon;Supervision for mobility/OOB     Equipment Recommendations  Rolling walker with 5" wheels    Recommendations for Other Services   NA     Precautions / Restrictions Precautions Precautions: None Restrictions LLE Weight Bearing: Weight bearing as tolerated    Mobility  Bed Mobility Overal bed mobility: Needs Assistance Bed Mobility: Sit to Supine       Sit to supine: Min assist   General bed mobility comments: Min assist to help lift left leg up into the bed.   Transfers Overall transfer level: Needs assistance Equipment used: Rolling walker (2 wheeled) Transfers: Sit to/from Stand Sit to Stand: Min guard         General transfer comment: Min guard assist for safety during transitions.   Ambulation/Gait Ambulation/Gait assistance: Min guard Ambulation Distance (Feet): 75 Feet Assistive device: Rolling walker (2 wheeled) Gait Pattern/deviations: Step-to pattern;Antalgic;Trunk flexed Gait velocity: decreased Gait velocity interpretation: Below normal speed for age/gender General Gait Details: Verbal cues for correct LE sequencing, upright posture and foot flat on the left (he wanted to walk on his toes on this side).  Verbal cues to remind him of  WBAT status on this side. New RW adjusted up to better fit his height, and pt took standing rest breaks to stand tall and streatch his hip.           Balance Overall balance assessment: Needs assistance Sitting-balance support: Feet supported;No upper extremity supported Sitting balance-Leahy Scale: Good     Standing balance support: Bilateral upper extremity supported Standing balance-Leahy Scale: Fair                              Cognition Arousal/Alertness: Awake/alert Behavior During Therapy: WFL for tasks assessed/performed Overall Cognitive Status: Within Functional Limits for tasks assessed                                        Exercises Total Joint Exercises Ankle Circles/Pumps: AROM;Both;20 reps Quad Sets: AROM;Left;10 reps Short Arc Quad: AROM;Left;10 reps Heel Slides: AAROM;Left;10 reps Hip ABduction/ADduction: AAROM;Left;10 reps General Exercises - Lower Extremity Mini-Sqauts: AROM;Both;10 reps;Other (comment)(with RW for support)        Pertinent Vitals/Pain Pain Assessment: 0-10 Pain Score: 8  Pain Location: left hip Pain Descriptors / Indicators: Aching;Burning Pain Intervention(s): Limited activity within patient's tolerance;Monitored during session;Repositioned           PT Goals (current goals can now be found in the care plan section) Acute Rehab PT Goals Patient Stated Goal: to feel  better  Progress towards PT goals: Progressing toward goals    Frequency    7X/week      PT Plan Current plan remains appropriate       AM-PAC PT "6 Clicks" Daily Activity  Outcome Measure  Difficulty turning over in bed (including adjusting bedclothes, sheets and blankets)?: Unable Difficulty moving from lying on back to sitting on the side of the bed? : Unable Difficulty sitting down on and standing up from a chair with arms (e.g., wheelchair, bedside commode, etc,.)?: Unable Help needed moving to and from a bed to  chair (including a wheelchair)?: A Little Help needed walking in hospital room?: A Little Help needed climbing 3-5 steps with a railing? : A Little 6 Click Score: 12    End of Session   Activity Tolerance: Patient limited by pain Patient left: with call bell/phone within reach;with family/visitor present;in bed;with bed alarm set   PT Visit Diagnosis: Other abnormalities of gait and mobility (R26.89);Pain Pain - Right/Left: Left Pain - part of body: Hip     Time: 1636-1700 PT Time Calculation (min) (ACUTE ONLY): 24 min  Charges:  $Gait Training: 8-22 mins $Therapeutic Exercise: 8-22 mins          Braylin Formby B. [place], [place], DPT [phone]            02/04/2017, 5:15 PM

## 2017-02-04 NOTE — Progress Notes (Signed)
Pt placing CPAP- no issues noted- Pt using mask and circuit from home.  Will cont to monitor progress.

## 2017-02-05 LAB — CBC WITH DIFFERENTIAL/PLATELET
Basophils Absolute: 0 10*3/uL (ref 0.0–0.1)
Basophils Relative: 0 %
Eosinophils Absolute: 0.5 10*3/uL (ref 0.0–0.7)
Eosinophils Relative: 6 %
HEMATOCRIT: 30.7 % — AB (ref 39.0–52.0)
Hemoglobin: 10.1 g/dL — ABNORMAL LOW (ref 13.0–17.0)
LYMPHS ABS: 1.9 10*3/uL (ref 0.7–4.0)
Lymphocytes Relative: 20 %
MCH: 27.8 pg (ref 26.0–34.0)
MCHC: 32.9 g/dL (ref 30.0–36.0)
MCV: 84.6 fL (ref 78.0–100.0)
MONO ABS: 1 10*3/uL (ref 0.1–1.0)
MONOS PCT: 10 %
NEUTROS ABS: 5.9 10*3/uL (ref 1.7–7.7)
Neutrophils Relative %: 64 %
PLATELETS: 200 10*3/uL (ref 150–400)
RBC: 3.63 MIL/uL — ABNORMAL LOW (ref 4.22–5.81)
RDW: 13.6 % (ref 11.5–15.5)
WBC: 9.3 10*3/uL (ref 4.0–10.5)

## 2017-02-05 LAB — BASIC METABOLIC PANEL
Anion gap: 8 (ref 5–15)
BUN: 9 mg/dL (ref 6–20)
CHLORIDE: 100 mmol/L — AB (ref 101–111)
CO2: 27 mmol/L (ref 22–32)
Calcium: 8.2 mg/dL — ABNORMAL LOW (ref 8.9–10.3)
Creatinine, Ser: 1.07 mg/dL (ref 0.61–1.24)
GFR calc Af Amer: >60 mL/min (ref >60)
GLUCOSE: 104 mg/dL — AB (ref 65–99)
POTASSIUM: 4.1 mmol/L (ref 3.5–5.1)
Sodium: 135 mmol/L (ref 135–145)

## 2017-02-05 MED ORDER — OXYCODONE-ACETAMINOPHEN 5-325 MG PO TABS: 1.0000 | ORAL_TABLET | ORAL | 0 refills | Status: DC | PRN

## 2017-02-05 MED ORDER — TIZANIDINE HCL 4 MG PO TABS: 4.0000 mg | ORAL_TABLET | Freq: Four times a day (QID) | ORAL | 1 refills | Status: AC | PRN

## 2017-02-05 MED ORDER — ASPIRIN 325 MG PO TBEC: 325.0000 mg | DELAYED_RELEASE_TABLET | Freq: Two times a day (BID) | ORAL | 0 refills | Status: DC

## 2017-02-05 MED ORDER — BISACODYL 5 MG PO TBEC: 5.0000 mg | DELAYED_RELEASE_TABLET | Freq: Every day | ORAL | 0 refills | Status: DC | PRN

## 2017-02-05 MED ORDER — DOCUSATE SODIUM 100 MG PO CAPS: 100.0000 mg | ORAL_CAPSULE | Freq: Two times a day (BID) | ORAL | 0 refills | Status: DC

## 2017-02-05 NOTE — Progress Notes (Signed)
Physical Therapy Treatment Patient Details Name: Travis Evans MRN: [id number] DOB: [birth year] Today's Date: 02/05/2017    History of Present Illness Pt is a 57 y/o male s/p elective L THA, direct anterior approach. PMH includes OSA on CPAP, s/p L heart cath.     PT Comments    Pt is POD #2 and is moving well, able to walk the whole unit with RW, practiced stairs simulating home entry and did standing exercises with min guard to supervision.  From a mobility standpoint, he is ready for d/c home, but MD will determine when he is medically ready.  PT to follow acutely until d/c confirmed.     Follow Up Recommendations  DC plan and follow up therapy as arranged by surgeon;Supervision for mobility/OOB     Equipment Recommendations  Rolling walker with 5" wheels    Recommendations for Other Services   NA     Precautions / Restrictions Precautions Precautions: None Restrictions LLE Weight Bearing: Weight bearing as tolerated    Mobility  Bed Mobility               General bed mobility comments: Pt was OOB in the recliner chair  Transfers Overall transfer level: Needs assistance Equipment used: Rolling walker (2 wheeled) Transfers: Sit to/from Stand Sit to Stand: Supervision         General transfer comment: Supervision for safety, cues for safe RW use during transitions.   Ambulation/Gait Ambulation/Gait assistance: Min guard;Supervision Ambulation Distance (Feet): 510 Feet Assistive device: Rolling walker (2 wheeled) Gait Pattern/deviations: Antalgic;Trunk flexed;Step-through pattern Gait velocity: decreased Gait velocity interpretation: Below normal speed for age/gender General Gait Details: Mildly antalgic gait pattern, better clearance of left foot today, improved to supervision by the end of the walk.    Stairs Stairs: Yes   Stair Management: No rails;Step to pattern;Backwards;With walker Number of Stairs: 2(x2 trials) General stair comments: Verbal  cues for LE sequencing and safe RW use.  Min assist to stabilize RW, min guard assist for balance at trunk. Practiced twice to reinforce teaching.          Balance Overall balance assessment: Needs assistance Sitting-balance support: Feet supported;No upper extremity supported Sitting balance-Leahy Scale: Good     Standing balance support: Bilateral upper extremity supported;No upper extremity supported Standing balance-Leahy Scale: Fair                              Cognition Arousal/Alertness: Awake/alert Behavior During Therapy: WFL for tasks assessed/performed Overall Cognitive Status: Within Functional Limits for tasks assessed                                        Exercises Total Joint Exercises Hip ABduction/ADduction: AAROM;Left;10 reps;Standing Knee Flexion: AROM;Left;10 reps;Standing Marching in Standing: AROM;Left;10 reps;Standing Standing Hip Extension: AROM;Left;10 reps;Standing General Exercises - Lower Extremity Mini-Sqauts: AROM;Both;10 reps;Other (comment)(with RW for support)        Pertinent Vitals/Pain Pain Assessment: 0-10 Pain Score: 6  Pain Location: left hip Pain Descriptors / Indicators: Aching;Burning Pain Intervention(s): Limited activity within patient's tolerance;Monitored during session;Repositioned;Ice applied           PT Goals (current goals can now be found in the care plan section) Acute Rehab PT Goals Patient Stated Goal: to feel better  Progress towards PT goals: Progressing toward goals    Frequency  7X/week      PT Plan Current plan remains appropriate       AM-PAC PT "6 Clicks" Daily Activity  Outcome Measure  Difficulty turning over in bed (including adjusting bedclothes, sheets and blankets)?: Unable Difficulty moving from lying on back to sitting on the side of the bed? : Unable Difficulty sitting down on and standing up from a chair with arms (e.g., wheelchair, bedside commode,  etc,.)?: None Help needed moving to and from a bed to chair (including a wheelchair)?: A Little Help needed walking in hospital room?: A Little Help needed climbing 3-5 steps with a railing? : A Little 6 Click Score: 15    End of Session   Activity Tolerance: Patient limited by pain Patient left: with call bell/phone within reach;with family/visitor present;in chair Nurse Communication: Mobility status PT Visit Diagnosis: Other abnormalities of gait and mobility (R26.89);Pain Pain - Right/Left: Left Pain - part of body: Hip     Time: 0370-4888 PT Time Calculation (min) (ACUTE ONLY): 34 min  Charges:  $Gait Training: 8-22 mins $Therapeutic Exercise: 8-22 mins  Rafferty Postlewait B. [place], [place], DPT [phone]          02/05/2017, 10:20 AM

## 2017-02-05 NOTE — Progress Notes (Signed)
Physical Therapy Treatment Patient Details Name: Travis Evans MRN: [id number] DOB: 02-Sep-1960 Today's Date: 02/05/2017    History of Present Illness Pt is a 57 y/o male s/p elective L THA, direct anterior approach. PMH includes OSA on CPAP, s/p L heart cath.     PT Comments    Pt is POD #2 and is moving well, supervision except for assist to lift his leg back into the bed.  He was able to show correct stair technique that he learned in his AM session and we completed his supine bed exercises.  From a mobility standpoint, he is ready for d/c home. PT to follow acutely until d/c confirmed.      Follow Up Recommendations  DC plan and follow up therapy as arranged by surgeon;Supervision for mobility/OOB     Equipment Recommendations  Rolling walker with 5" wheels    Recommendations for Other Services   NA     Precautions / Restrictions Precautions Precautions: None Restrictions LLE Weight Bearing: Weight bearing as tolerated    Mobility  Bed Mobility Overal bed mobility: Needs Assistance Bed Mobility: Supine to Sit;Sit to Supine     Supine to sit: Supervision Sit to supine: Min assist   General bed mobility comments: Supervision to get EOB, min assist to help lift left leg back into bed when returning to supine.   Transfers Overall transfer level: Needs assistance Equipment used: Rolling walker (2 wheeled) Transfers: Sit to/from Stand Sit to Stand: Modified independent (Device/Increase time);From elevated surface         General transfer comment: Mod I with hands to control transitions. Bed elevated to simulate home bed.   Ambulation/Gait Ambulation/Gait assistance: Supervision Ambulation Distance (Feet): 130 Feet Assistive device: Rolling walker (2 wheeled) Gait Pattern/deviations: Antalgic;Trunk flexed;Step-through pattern Gait velocity: decreased Gait velocity interpretation: Below normal speed for age/gender General Gait Details: Mildly antalgic gait  pattern, better clearance of left foot today.  This is pt's third walk.  He waked with his daughter again before she left and walked with me earlier this AM.    Stairs     Stair Management: No rails;Step to pattern;Backwards;With walker Number of Stairs: 2 General stair comments: Pt was able to demonstrate correct technique without cues.          Balance Overall balance assessment: Needs assistance Sitting-balance support: Feet supported;No upper extremity supported Sitting balance-Leahy Scale: Good     Standing balance support: Bilateral upper extremity supported;No upper extremity supported Standing balance-Leahy Scale: Fair                              Cognition Arousal/Alertness: Awake/alert Behavior During Therapy: WFL for tasks assessed/performed Overall Cognitive Status: Within Functional Limits for tasks assessed                                        Exercises Total Joint Exercises Ankle Circles/Pumps: AROM;Both;20 reps Quad Sets: AROM;Left;10 reps Short Arc Quad: AROM;Left;10 reps Heel Slides: AAROM;Left;10 reps Hip ABduction/ADduction: AAROM;Left;10 reps General Exercises - Lower Extremity Mini-Sqauts: AROM;Both;10 reps;Other (comment)(with RW for support)        Pertinent Vitals/Pain Pain Assessment: 0-10 Pain Score: 6  Pain Location: left hip Pain Descriptors / Indicators: Aching;Burning Pain Intervention(s): Limited activity within patient's tolerance;Monitored during session;Repositioned;Ice applied           PT Goals (current  goals can now be found in the care plan section) Acute Rehab PT Goals Patient Stated Goal: to feel better  Progress towards PT goals: Progressing toward goals    Frequency    7X/week      PT Plan Current plan remains appropriate       AM-PAC PT "6 Clicks" Daily Activity  Outcome Measure  Difficulty turning over in bed (including adjusting bedclothes, sheets and blankets)?:  None Difficulty moving from lying on back to sitting on the side of the bed? : None Difficulty sitting down on and standing up from a chair with arms (e.g., wheelchair, bedside commode, etc,.)?: None Help needed moving to and from a bed to chair (including a wheelchair)?: None Help needed walking in hospital room?: None Help needed climbing 3-5 steps with a railing? : A Little 6 Click Score: 23    End of Session   Activity Tolerance: Patient limited by pain Patient left: with call bell/phone within reach;in bed Nurse Communication: Mobility status PT Visit Diagnosis: Other abnormalities of gait and mobility (R26.89);Pain Pain - Right/Left: Left Pain - part of body: Hip     Time: 3295-1884 PT Time Calculation (min) (ACUTE ONLY): 26 min  Charges:  $Gait Training: 8-22 mins $Therapeutic Exercise: 8-22 mins                    Endi Lagman B. [place], [place], DPT [phone]   02/05/2017, 2:46 PM

## 2017-02-05 NOTE — Discharge Summary (Signed)
Patient ID: Travis Evans MRN: [id number] DOB/AGE: [birth year] 57 y.o.  Admit date: 02/03/2017 Discharge date: 02/05/2017  Admission Diagnoses:  Principal Problem:   Primary osteoarthritis of left hip   Discharge Diagnoses:  Same  Past Medical History:  Diagnosis Date  . Chest pain   . Complication of anesthesia    difficulty waking  . DJD (degenerative joint disease)    left hip  . Full dentures   . Gastroesophageal reflux   . Lower back injury   . Peptic ulcer   . Sleep apnea    wears CPAP  . Thyroid disease   . Wears glasses     Surgeries: Procedure(s): TOTAL HIP ARTHROPLASTY ANTERIOR APPROACH on 02/03/2017   Consultants:   Discharged Condition: Improved  Hospital Course: KDEN WAGSTER is an 57 y.o. male who was admitted 02/03/2017 for operative treatment ofPrimary osteoarthritis of left hip. Patient has severe unremitting pain that affects sleep, daily activities, and work/hobbies. After pre-op clearance the patient was taken to the operating room on 02/03/2017 and underwent  Procedure(s): TOTAL HIP ARTHROPLASTY ANTERIOR APPROACH.    Patient was given perioperative antibiotics:  Anti-infectives (From admission, onward)   Start     Dose/Rate Route Frequency Ordered Stop   02/03/17 1700  ceFAZolin (ANCEF) IVPB 2g/100 mL premix     2 g 200 mL/hr over 30 Minutes Intravenous Every 6 hours 02/03/17 1618 02/04/17 0025   02/03/17 0809  ceFAZolin (ANCEF) IVPB 2g/100 mL premix     2 g 200 mL/hr over 30 Minutes Intravenous On call to O.R. 02/03/17 0809 02/03/17 1005       Patient was given sequential compression devices, early ambulation, and chemoprophylaxis to prevent DVT.  Patient benefited maximally from hospital stay and there were no complications.    Recent vital signs:  Patient Vitals for the past 24 hrs:  BP Temp Temp src Pulse Resp SpO2  02/05/17 1437 133/79 98.3 F (36.8 C) Oral (!) 107 18 94 %  02/05/17 0943 - 99.9 F (37.7 C) Oral - - -  02/05/17  0726 - 98.2 F (36.8 C) - - - -  02/05/17 0534 (!) 147/84 98.3 F (36.8 C) Oral (!) 106 17 95 %  02/05/17 0154 - 98.5 F (36.9 C) - - - -  02/04/17 2350 - 99.6 F (37.6 C) Oral - - -  02/04/17 2154 - (!) 102.7 F (39.3 C) - (!) 118 - -  02/04/17 2054 138/74 100 F (37.8 C) Oral (!) 124 18 93 %     Recent laboratory studies:  Recent Labs    02/05/17 0602  WBC 9.3  HGB 10.1*  HCT 30.7*  PLT 200  NA 135  K 4.1  CL 100*  CO2 27  BUN 9  CREATININE 1.07  GLUCOSE 104*  CALCIUM 8.2*     Discharge Medications:   Allergies as of 02/05/2017   No Known Allergies     Medication List    STOP taking these medications   ibuprofen 200 MG tablet Commonly known as:  ADVIL,MOTRIN     TAKE these medications   aspirin 325 MG EC tablet Take 1 tablet (325 mg total) by mouth 2 (two) times daily after a meal.   azelastine 0.1 % nasal spray Commonly known as:  ASTELIN Place 1 spray into both nostrils at bedtime. Use in each nostril as directed   bisacodyl 5 MG EC tablet Commonly known as:  DULCOLAX Take 1 tablet (5 mg total) by mouth daily  as needed for moderate constipation.   cholecalciferol 1000 units tablet Commonly known as:  VITAMIN D Take 1,000 Units by mouth daily.   docusate sodium 100 MG capsule Commonly known as:  COLACE Take 1 capsule (100 mg total) by mouth 2 (two) times daily.   EPIPEN 2-PAK 0.3 mg/0.3 mL Soaj injection Generic drug:  EPINEPHrine Inject 0.3 mg into the muscle once as needed (allergic reaction).   levothyroxine 50 MCG tablet Commonly known as:  SYNTHROID, LEVOTHROID Take 50 mcg by mouth at bedtime.   loratadine 10 MG tablet Commonly known as:  CLARITIN Take 10 mg by mouth at bedtime.   LUBRICANT EYE NIGHTTIME Oint Place 1 application into the right eye at bedtime.   oxyCODONE-acetaminophen 5-325 MG tablet Commonly known as:  PERCOCET Take 1-2 tablets by mouth every 4 (four) hours as needed for severe pain.   pantoprazole 40 MG  tablet Commonly known as:  PROTONIX Take 40 mg by mouth at bedtime.   tiZANidine 4 MG tablet Commonly known as:  ZANAFLEX Take 1 tablet (4 mg total) by mouth every 6 (six) hours as needed for muscle spasms.   vitamin C 500 MG tablet Commonly known as:  ASCORBIC ACID Take 500 mg by mouth 2 (two) times daily.            Durable Medical Equipment  (From admission, onward)        Start     Ordered   02/03/17 1619  DME Walker rolling  Once    Question:  Patient needs a walker to treat with the following condition  Answer:  Primary osteoarthritis of left hip   02/03/17 1618   02/03/17 1619  DME 3 n 1  Once     02/03/17 1618   02/03/17 1619  DME Bedside commode  Once    Question:  Patient needs a bedside commode to treat with the following condition  Answer:  Primary osteoarthritis of left hip   02/03/17 1618      Diagnostic Studies: Dg Chest 2 View  Result Date: 02/03/2017 CLINICAL DATA:  Preoperative examination prior to hip replacement. No current chest complaints. Former smoker. EXAM: CHEST  2 VIEW COMPARISON:  Portable chest x-ray of March 30, 2016 FINDINGS: The lungs are well-expanded and clear. The cardiac silhouette is mildly enlarged but stable. The pulmonary vascularity is normal. The mediastinum is normal in width. There is no pleural effusion. There mild degenerative disc disease in the mid and upper thoracic spine. IMPRESSION: Mild stable cardiomegaly without evidence of pulmonary vascular congestion or pulmonary edema. No acute cardiopulmonary abnormality. Electronically Signed   By: David  Martinique M.D.   On: 02/03/2017 08:44   Dg Chest Port 1 View  Result Date: 02/04/2017 CLINICAL DATA:  Cough fever and tachycardia EXAM: PORTABLE CHEST 1 VIEW COMPARISON:  02/03/2017, 03/30/2016 FINDINGS: Borderline to mild cardiomegaly. Both lungs are clear. The visualized skeletal structures are unremarkable. IMPRESSION: No active disease. Electronically Signed   By: Donavan Foil M.D.    On: 02/04/2017 22:22   Dg Fluoro Guided Needle Plc Aspiration/injection Loc  Result Date: 01/16/2017 CLINICAL DATA:  LEFT hip osteoarthritis. EXAM: LEFT HIP INJECTION UNDER FLUOROSCOPY FLUOROSCOPY TIME:  19 seconds corresponding to a Dose Area Product of 30.91 Gy*m2 PROCEDURE: Informed written consent was obtained.  Time-out was performed. Overlying skin prepped with Betadine, draped in the usual sterile fashion, and infiltrated locally with 1% Lidocaine. Curved 22 gauge spinal needle advanced to the superolateral margin of the LEFT femoral head.  1 ml of Lidocaine injected easily. Diagnostic injection of iodinated contrast demonstrates intra-articular spread without intravascular component. 120mg  Depo-Medrol and 5 ml Sensorcaine 0.5% were then administered. No immediate complication. IMPRESSION: Technically successful LEFT hip injection under fluoroscopy. Electronically Signed   By: Staci Righter M.D.   On: 01/16/2017 09:33   Dg C-arm 1-60 Min  Result Date: 02/03/2017 CLINICAL DATA:  Left anterior hip EXAM: OPERATIVE LEFT HIP (WITH PELVIS IF PERFORMED) 2 VIEWS TECHNIQUE: Fluoroscopic spot image(s) were submitted for interpretation post-operatively. FLUOROSCOPY TIME:  42 seconds COMPARISON:  None. FINDINGS: Intraoperative fluoroscopic images during left total hip arthroplasty, in satisfactory position. IMPRESSION: Intraoperative fluoroscopic images during left total hip arthroplasty. Electronically Signed   By: Julian Hy M.D.   On: 02/03/2017 11:50   Dg Hip Operative Unilat W Or W/o Pelvis Left  Result Date: 02/03/2017 CLINICAL DATA:  Left anterior hip EXAM: OPERATIVE LEFT HIP (WITH PELVIS IF PERFORMED) 2 VIEWS TECHNIQUE: Fluoroscopic spot image(s) were submitted for interpretation post-operatively. FLUOROSCOPY TIME:  42 seconds COMPARISON:  None. FINDINGS: Intraoperative fluoroscopic images during left total hip arthroplasty, in satisfactory position. IMPRESSION: Intraoperative fluoroscopic  images during left total hip arthroplasty. Electronically Signed   By: Julian Hy M.D.   On: 02/03/2017 11:50    Disposition: 01-Home or Self Care  Discharge Instructions    Call MD / Call 911   Complete by:  As directed    If you experience chest pain or shortness of breath, CALL 911 and be transported to the hospital emergency room.  If you develope a fever above 101 F, pus (white drainage) or increased drainage or redness at the wound, or calf pain, call your surgeon's office.   Constipation Prevention   Complete by:  As directed    Drink plenty of fluids.  Prune juice may be helpful.  You may use a stool softener, such as Colace (over the counter) 100 mg twice a day.  Use MiraLax (over the counter) for constipation as needed.   Diet - low sodium heart healthy   Complete by:  As directed    Discharge instructions   Complete by:  As directed    INSTRUCTIONS AFTER JOINT REPLACEMENT   Remove items at home which could result in a fall. This includes throw rugs or furniture in walking pathways ICE to the affected joint every three hours while awake for 30 minutes at a time, for at least the first 3-5 days, and then as needed for pain and swelling.  Continue to use ice for pain and swelling. You may notice swelling that will progress down to the foot and ankle.  This is normal after surgery.  Elevate your leg when you are not up walking on it.   Continue to use the breathing machine you got in the hospital (incentive spirometer) which will help keep your temperature down.  It is common for your temperature to cycle up and down following surgery, especially at night when you are not up moving around and exerting yourself.  The breathing machine keeps your lungs expanded and your temperature down.   DIET:  As you were doing prior to hospitalization, we recommend a well-balanced diet.  DRESSING / WOUND CARE / SHOWERING  You may shower 3 days after surgery, but keep the wounds dry during  showering.  You may use an occlusive plastic wrap (Press'n Seal for example), NO SOAKING/SUBMERGING IN THE BATHTUB.  If the bandage gets wet, change with a clean dry gauze.  If the incision  gets wet, pat the wound dry with a clean towel.  ACTIVITY  Increase activity slowly as tolerated, but follow the weight bearing instructions below.   No driving for 6 weeks or until further direction given by your physician.  You cannot drive while taking narcotics.  No lifting or carrying greater than 10 lbs. until further directed by your surgeon. Avoid periods of inactivity such as sitting longer than an hour when not asleep. This helps prevent blood clots.  You may return to work once you are authorized by your doctor.     WEIGHT BEARING   Weight bearing as tolerated with assist device (walker, cane, etc) as directed, use it as long as suggested by your surgeon or therapist, typically at least 4-6 weeks.   EXERCISES  Results after joint replacement surgery are often greatly improved when you follow the exercise, range of motion and muscle strengthening exercises prescribed by your doctor. Safety measures are also important to protect the joint from further injury. Any time any of these exercises cause you to have increased pain or swelling, decrease what you are doing until you are comfortable again and then slowly increase them. If you have problems or questions, call your caregiver or physical therapist for advice.   Rehabilitation is important following a joint replacement. After just a few days of immobilization, the muscles of the leg can become weakened and shrink (atrophy).  These exercises are designed to build up the tone and strength of the thigh and leg muscles and to improve motion. Often times heat used for twenty to thirty minutes before working out will loosen up your tissues and help with improving the range of motion but do not use heat for the first two weeks following surgery (sometimes  heat can increase post-operative swelling).   These exercises can be done on a training (exercise) mat, on the floor, on a table or on a bed. Use whatever works the best and is most comfortable for you.    Use music or television while you are exercising so that the exercises are a pleasant break in your day. This will make your life better with the exercises acting as a break in your routine that you can look forward to.   Perform all exercises about fifteen times, three times per day or as directed.  You should exercise both the operative leg and the other leg as well.   Exercises include:   Quad Sets - Tighten up the muscle on the front of the thigh (Quad) and hold for 5-10 seconds.   Straight Leg Raises - With your knee straight (if you were given a brace, keep it on), lift the leg to 60 degrees, hold for 3 seconds, and slowly lower the leg.  Perform this exercise against resistance later as your leg gets stronger.  Leg Slides: Lying on your back, slowly slide your foot toward your buttocks, bending your knee up off the floor (only go as far as is comfortable). Then slowly slide your foot back down until your leg is flat on the floor again.  Angel Wings: Lying on your back spread your legs to the side as far apart as you can without causing discomfort.  Hamstring Strength:  Lying on your back, push your heel against the floor with your leg straight by tightening up the muscles of your buttocks.  Repeat, but this time bend your knee to a comfortable angle, and push your heel against the floor.  You may put a  pillow under the heel to make it more comfortable if necessary.   A rehabilitation program following joint replacement surgery can speed recovery and prevent re-injury in the future due to weakened muscles. Contact your doctor or a physical therapist for more information on knee rehabilitation.    CONSTIPATION  Constipation is defined medically as fewer than three stools per week and severe  constipation as less than one stool per week.  Even if you have a regular bowel pattern at home, your normal regimen is likely to be disrupted due to multiple reasons following surgery.  Combination of anesthesia, postoperative narcotics, change in appetite and fluid intake all can affect your bowels.   YOU MUST use at least one of the following options; they are listed in order of increasing strength to get the job done.  They are all available over the counter, and you may need to use some, POSSIBLY even all of these options:    Drink plenty of fluids (prune juice may be helpful) and high fiber foods Colace 100 mg by mouth twice a day  Senokot for constipation as directed and as needed Dulcolax (bisacodyl), take with full glass of water  Miralax (polyethylene glycol) once or twice a day as needed.  If you have tried all these things and are unable to have a bowel movement in the first 3-4 days after surgery call either your surgeon or your primary doctor.    If you experience loose stools or diarrhea, hold the medications until you stool forms back up.  If your symptoms do not get better within 1 week or if they get worse, check with your doctor.  If you experience "the worst abdominal pain ever" or develop nausea or vomiting, please contact the office immediately for further recommendations for treatment.   ITCHING:  If you experience itching with your medications, try taking only a single pain pill, or even half a pain pill at a time.  You can also use Benadryl over the counter for itching or also to help with sleep.   TED HOSE STOCKINGS:  Use stockings on both legs until for at least 2 weeks or as directed by physician office. They may be removed at night for sleeping.  MEDICATIONS:  See your medication summary on the "After Visit Summary" that nursing will review with you.  You may have some home medications which will be placed on hold until you complete the course of blood thinner  medication.  It is important for you to complete the blood thinner medication as prescribed.  PRECAUTIONS:  If you experience chest pain or shortness of breath - call 911 immediately for transfer to the hospital emergency department.   If you develop a fever greater that 101 F, purulent drainage from wound, increased redness or drainage from wound, foul odor from the wound/dressing, or calf pain - CONTACT YOUR SURGEON.                                                   FOLLOW-UP APPOINTMENTS:  If you do not already have a post-op appointment, please call the office for an appointment to be seen by your surgeon.  Guidelines for how soon to be seen are listed in your "After Visit Summary", but are typically between 1-4 weeks after surgery.  OTHER INSTRUCTIONS:   Knee Replacement:  Do  not place pillow under knee, focus on keeping the knee straight while resting. CPM instructions: 0-90 degrees, 2 hours in the morning, 2 hours in the afternoon, and 2 hours in the evening. Place foam block, curve side up under heel at all times except when in CPM or when walking.  DO NOT modify, tear, cut, or change the foam block in any way.  MAKE SURE YOU:  Understand these instructions.  Get help right away if you are not doing well or get worse.    Thank you for letting us be a part of your medical care team.  It is a privilege we respect greatly.  We hope these instructions will help you stay on track for a fast and full recovery!   Increase activity slowly as tolerated   Complete by:  As directed       Follow-up Information    Melrose Nakayama, MD. Schedule an appointment as soon as possible for a visit in 2 week(s).   Specialty:  Orthopedic Surgery Contact information: [address] [place] [address] [phone]        [facility], [facility] Follow up.   Specialty:  Home Health Services Why:  phone [phone] will provide home health PT start of care will be February 09, 2017  Contact  information: [address] [place] [address] [phone]            Signed: Rich Fuchs 02/05/2017, 4:02 PM

## 2017-02-05 NOTE — Progress Notes (Signed)
Subjective: 2 Days Post-Op Procedure(s) (LRB): TOTAL HIP ARTHROPLASTY ANTERIOR APPROACH (Left)   Patient feels great this morning. He is currently afebrile. He would like to go home but wants to make sure he doesn't spike a fever today.  Activity level:  wbat Diet tolerance:  ok Voiding:  ok Patient reports pain as mild.    Objective: Vital signs in last 24 hours: Temp:  [98.3 F (36.8 C)-102.7 F (39.3 C)] 98.3 F (36.8 C) (01/24 0534) Pulse Rate:  [102-124] 106 (01/24 0534) Resp:  [15-18] 17 (01/24 0534) BP: (105-147)/(65-84) 147/84 (01/24 0534) SpO2:  [92 %-98 %] 95 % (01/24 0534)  Labs: No results for input(s): HGB in the last 72 hours. No results for input(s): WBC, RBC, HCT, PLT in the last 72 hours. No results for input(s): NA, K, CL, CO2, BUN, CREATININE, GLUCOSE, CALCIUM in the last 72 hours. No results for input(s): LABPT, INR in the last 72 hours.  Physical Exam:  Neurologically intact ABD soft Neurovascular intact Sensation intact distally Intact pulses distally Dorsiflexion/Plantar flexion intact Incision: dressing C/D/I and no drainage No cellulitis present Compartment soft  Assessment/Plan:  2 Days Post-Op Procedure(s) (LRB): TOTAL HIP ARTHROPLASTY ANTERIOR APPROACH (Left) Advance diet Up with therapy  Chest xray is clear and normal. Fever likely from acute inflammatory response along with atelectasis. I will check back later today and if he has done well with no fever then I will discharge hom home this afternoon.  If not feelign well then tomorrow. Continue on ASA 325mg  BID x 4 weeks post op. Follow up in office 2 weeks post op.    Kiet Geer, [name] 02/05/2017, 7:14 AM

## 2017-02-05 NOTE — Care Management Note (Addendum)
Case Management Note  Patient Details  Name: Travis Evans MRN: [id number] Date of Birth: 09-21-1960  Subjective/Objective:                    Action/Plan:  Emma From [place] called and declined referral due to not being in network with [place]. Patient aware , second choice was [facility] spoke to [place] there they are in network with [place] but do not have the staffing to accept referral.  [facility] are both now owned by [facility] and not in network with [place].  [facility] does not cover patient's address.  [facility] is not in network with [place] [facility].  [facility] [phone] , spoke with Butch Penny, they do not cover patient's address.  Mariann Laster at [facility] phone [phone] fax [phone] is checking patient's insurance policy to see if they are in network.    [facility] phone [phone] fax [phone] also checking to see if they are in network with patient's BCBS   [facility] unable to accept referral .   Melody with [facility] returned call , she can accept referral however start of care will not be until Monday February 09, 2017 . Patient aware , currently working with PT. PT instructed patient to do exercises on his own until Monday.    Expected Discharge Date:                  Expected Discharge Plan:  [place]  In-House Referral:     Discharge planning Services  CM Consult  Post Acute Care Choice:  Durable Medical Equipment, Home Health Choice offered to:  Patient, Spouse, Adult Children  DME Arranged:  Walker rolling DME Agency:  [place]:  PT [place] Agency:  Other - See comment  Status of Service:  In process, will continue to follow  If discussed at Long Length of Stay Meetings, dates discussed:    Additional Comments:  Marilu Favre, RN 02/05/2017, 1:53 PM

## 2018-07-09 ENCOUNTER — Other Ambulatory Visit: Payer: Self-pay | Admitting: Orthopaedic Surgery

## 2018-08-06 ENCOUNTER — Other Ambulatory Visit: Payer: Self-pay

## 2018-08-06 ENCOUNTER — Other Ambulatory Visit (HOSPITAL_COMMUNITY): Payer: BLUE CROSS/BLUE SHIELD

## 2018-08-06 ENCOUNTER — Encounter (HOSPITAL_COMMUNITY)
Admission: RE | Admit: 2018-08-06 | Discharge: 2018-08-06 | Disposition: A | Discharge status: Home / Self Care | Payer: BC Managed Care – PPO | Source: Ambulatory Visit | Attending: Orthopaedic Surgery | Admitting: Orthopaedic Surgery

## 2018-08-06 ENCOUNTER — Other Ambulatory Visit (HOSPITAL_COMMUNITY)
Admission: RE | Admit: 2018-08-06 | Discharge: 2018-08-06 | Disposition: A | Discharge status: Home / Self Care | Payer: BC Managed Care – PPO | Source: Ambulatory Visit | Attending: Orthopaedic Surgery | Admitting: Orthopaedic Surgery

## 2018-08-06 ENCOUNTER — Encounter (HOSPITAL_COMMUNITY): Payer: Self-pay

## 2018-08-06 ENCOUNTER — Other Ambulatory Visit (HOSPITAL_COMMUNITY): Payer: Self-pay | Admitting: *Deleted

## 2018-08-06 DIAGNOSIS — Z1159 Encounter for screening for other viral diseases: Secondary | ICD-10-CM | POA: Insufficient documentation

## 2018-08-06 DIAGNOSIS — M1611 Unilateral primary osteoarthritis, right hip: Secondary | ICD-10-CM | POA: Insufficient documentation

## 2018-08-06 DIAGNOSIS — Z01812 Encounter for preprocedural laboratory examination: Secondary | ICD-10-CM | POA: Insufficient documentation

## 2018-08-06 DIAGNOSIS — M879 Osteonecrosis, unspecified: Secondary | ICD-10-CM | POA: Insufficient documentation

## 2018-08-06 DIAGNOSIS — M87059 Idiopathic aseptic necrosis of unspecified femur: Secondary | ICD-10-CM | POA: Insufficient documentation

## 2018-08-06 HISTORY — DX: Abnormal electrocardiogram (ECG) (EKG): R94.31

## 2018-08-06 LAB — ABO/RH: ABO/RH(D): O POS

## 2018-08-06 LAB — SURGICAL PCR SCREEN
MRSA, PCR: NEGATIVE
Staphylococcus aureus: NEGATIVE

## 2018-08-06 NOTE — Progress Notes (Addendum)
[facility] the following records on chart done 08-04-2018: Ekg, cbc with dif, bmet, pt, ptt, ua

## 2018-08-06 NOTE — Progress Notes (Signed)
Chest xray 08-04-18 [facility] on chart. Travis zanetto pa aware of ekg 08-04-18 and cardiac cath 2018 results, pstient ok for surgery per Travis zanetto pa.

## 2018-08-06 NOTE — Patient Instructions (Signed)
YOU NEED TO HAVE A COVID 19 TEST ON 08-06-2018 at 200 pm.  THIS TEST MUST BE DONE BEFORE SURGERY, COME  [place], [place] , [address]. ONCE YOUR COVID TEST IS COMPLETED, PLEASE BEGIN THE QUARANTINE INSTRUCTIONS AS OUTLINED IN YOUR HANDOUT.                Travis Evans    Your procedure is scheduled on: 08-10-2018  Report to [facility] Main  Entrance  Report to admitting at 720 AM   1 VISITOR IS ALLOWED TO WAIT IN WAITING ROOM  ONLY DAY OF YOUR SURGERY.    Call this number if you have problems the morning of surgery [phone]    Remember: [place], NO CHEWING GUM CANDY OR MINTS.   NO SOLID FOOD AFTER MIDNIGHT THE NIGHT PRIOR TO SURGERY. NOTHING BY MOUTH EXCEPT CLEAR LIQUIDS UNTIL 650 am. PLEASE FINISH ENSURE DRINK PER SURGEON ORDER 3 HOURS PRIOR TO SCHEDULED SURGERY TIME WHICH NEEDS TO BE COMPLETED AT 650 am.  CLEAR LIQUID DIET   Foods Allowed                                                                     Foods Excluded  Coffee and tea, regular and decaf                             liquids that you cannot  Plain Jell-O any favor except red or purple                                           see through such as: Fruit ices (not with fruit pulp)                                     milk, soups, orange juice  Iced Popsicles                                    All solid food Carbonated beverages, regular and diet                                    Cranberry, grape and apple juices Sports drinks like Gatorade Lightly seasoned clear broth or consume(fat free) Sugar, honey syrup  Sample Menu Breakfast                                Lunch                                     Supper Cranberry juice                    Beef broth  Chicken broth Jell-O                                     Grape juice                           Apple juice Coffee or tea                        Jell-O                                       Popsicle                                                Coffee or tea                        Coffee or tea  _____________________________________________________________________     Take these medicines the morning of surgery with A SIP OF WATER: none                               You may not have any metal on your body including hair pins and              piercings  Do not wear jewelry, make-up, lotions, powders or perfumes, deodorant             Do not wear nail polish.  Do not shave  48 hours prior to surgery.              Men may shave face and neck.   Do not bring valuables to the hospital. [place].  Contacts, dentures or bridgework may not be worn into surgery.  Leave suitcase in the car. After surgery it may be brought to your room.     _____________________________________________________________________             [facility] - Preparing for Surgery Before surgery, you can play an important role.  Because skin is not sterile, your skin needs to be as free of germs as possible.  You can reduce the number of germs on your skin by washing with CHG (chlorahexidine gluconate) soap before surgery.  CHG is an antiseptic cleaner which kills germs and bonds with the skin to continue killing germs even after washing. Please DO NOT use if you have an allergy to CHG or antibacterial soaps.  If your skin becomes reddened/irritated stop using the CHG and inform your nurse when you arrive at Short Stay. Do not shave (including legs and underarms) for at least 48 hours prior to the first CHG shower.  You may shave your face/neck. Please follow these instructions carefully:  1.  Shower with CHG Soap the night before surgery and the  morning of Surgery.  2.  If you choose to wash your hair, wash your hair first as usual with your  normal  shampoo.  3.  After you shampoo, rinse your hair  and body thoroughly to  remove the  shampoo.                           4.  Use CHG as you would any other liquid soap.  You can apply chg directly  to the skin and wash                       Gently with a scrungie or clean washcloth.  5.  Apply the CHG Soap to your body ONLY FROM THE NECK DOWN.   Do not use on face/ open                           Wound or open sores. Avoid contact with eyes, ears mouth and genitals (private parts).                       Wash face,  Genitals (private parts) with your normal soap.             6.  Wash thoroughly, paying special attention to the area where your surgery  will be performed.  7.  Thoroughly rinse your body with warm water from the neck down.  8.  DO NOT shower/wash with your normal soap after using and rinsing off  the CHG Soap.                9.  Pat yourself dry with a clean towel.            10.  Wear clean pajamas.            11.  Place clean sheets on your bed the night of your first shower and do not  sleep with pets. Day of Surgery : Do not apply any lotions/deodorants the morning of surgery.  Please wear clean clothes to the hospital/surgery center.  FAILURE TO FOLLOW THESE INSTRUCTIONS MAY RESULT IN THE CANCELLATION OF YOUR SURGERY PATIENT SIGNATURE_________________________________  NURSE SIGNATURE__________________________________  ________________________________________________________________________   Travis Evans  An incentive spirometer is a tool that can help keep your lungs clear and active. This tool measures how well you are filling your lungs with each breath. Taking long deep breaths may help reverse or decrease the chance of developing breathing (pulmonary) problems (especially infection) following:  A long period of time when you are unable to move or be active. BEFORE THE PROCEDURE   If the spirometer includes an indicator to show your best effort, your nurse or respiratory therapist will set it to a desired goal.  If possible, sit  up straight or lean slightly forward. Try not to slouch.  Hold the incentive spirometer in an upright position. INSTRUCTIONS FOR USE  1. Sit on the edge of your bed if possible, or sit up as far as you can in bed or on a chair. 2. Hold the incentive spirometer in an upright position. 3. Breathe out normally. 4. Place the mouthpiece in your mouth and seal your lips tightly around it. 5. Breathe in slowly and as deeply as possible, raising the piston or the ball toward the top of the column. 6. Hold your breath for 3-5 seconds or for as long as possible. Allow the piston or ball to fall to the bottom of the column. 7. Remove the mouthpiece from your mouth and breathe out normally. 8. Rest for a few seconds and repeat Steps  1 through 7 at least 10 times every 1-2 hours when you are awake. Take your time and take a few normal breaths between deep breaths. 9. The spirometer may include an indicator to show your best effort. Use the indicator as a goal to work toward during each repetition. 10. After each set of 10 deep breaths, practice coughing to be sure your lungs are clear. If you have an incision (the cut made at the time of surgery), support your incision when coughing by placing a pillow or rolled up towels firmly against it. Once you are able to get out of bed, walk around indoors and cough well. You may stop using the incentive spirometer when instructed by your caregiver.  RISKS AND COMPLICATIONS  Take your time so you do not get dizzy or light-headed.  If you are in pain, you may need to take or ask for pain medication before doing incentive spirometry. It is harder to take a deep breath if you are having pain. AFTER USE  Rest and breathe slowly and easily.  It can be helpful to keep track of a log of your progress. Your caregiver can provide you with a simple table to help with this. If you are using the spirometer at home, follow these instructions: [place] IF:   You are  having difficultly using the spirometer.  You have trouble using the spirometer as often as instructed.  Your pain medication is not giving enough relief while using the spirometer.  You develop fever of 100.5 F (38.1 C) or higher. SEEK IMMEDIATE MEDICAL CARE IF:   You cough up bloody sputum that had not been present before.  You develop fever of 102 F (38.9 C) or greater.  You develop worsening pain at or near the incision site. MAKE SURE YOU:   Understand these instructions.  Will watch your condition.  Will get help right away if you are not doing well or get worse. Document Released: 05/12/2006 Document Revised: 03/24/2011 Document Reviewed: 07/13/2006 ExitCare Patient Information 2014 [employer], [place].   ________________________________________________________________________  WHAT IS A BLOOD TRANSFUSION? Blood Transfusion Information  A transfusion is the replacement of blood or some of its parts. Blood is made up of multiple cells which provide different functions.  Red blood cells carry oxygen and are used for blood loss replacement.  White blood cells fight against infection.  Platelets control bleeding.  Plasma helps clot blood.  Other blood products are available for specialized needs, such as hemophilia or other clotting disorders. BEFORE THE TRANSFUSION  Who gives blood for transfusions?   Healthy volunteers who are fully evaluated to make sure their blood is safe. This is blood bank blood. Transfusion therapy is the safest it has ever been in the practice of medicine. Before blood is taken from a donor, a complete history is taken to make sure that person has no history of diseases nor engages in risky social behavior (examples are intravenous drug use or sexual activity with multiple partners). The donor's travel history is screened to minimize risk of transmitting infections, such as malaria. The donated blood is tested for signs of infectious diseases,  such as HIV and hepatitis. The blood is then tested to be sure it is compatible with you in order to minimize the chance of a transfusion reaction. If you or a relative donates blood, this is often done in anticipation of surgery and is not appropriate for emergency situations. It takes many days to process the donated blood. RISKS  AND COMPLICATIONS Although transfusion therapy is very safe and saves many lives, the main dangers of transfusion include:   Getting an infectious disease.  Developing a transfusion reaction. This is an allergic reaction to something in the blood you were given. Every precaution is taken to prevent this. The decision to have a blood transfusion has been considered carefully by your caregiver before blood is given. Blood is not given unless the benefits outweigh the risks. AFTER THE TRANSFUSION  Right after receiving a blood transfusion, you will usually feel much better and more energetic. This is especially true if your red blood cells have gotten low (anemic). The transfusion raises the level of the red blood cells which carry oxygen, and this usually causes an energy increase.  The nurse administering the transfusion will monitor you carefully for complications. HOME CARE INSTRUCTIONS  No special instructions are needed after a transfusion. You may find your energy is better. Speak with your caregiver about any limitations on activity for underlying diseases you may have. SEEK MEDICAL CARE IF:   Your condition is not improving after your transfusion.  You develop redness or irritation at the intravenous (IV) site. SEEK IMMEDIATE MEDICAL CARE IF:  Any of the following symptoms occur over the next 12 hours:  Shaking chills.  You have a temperature by mouth above 102 F (38.9 C), not controlled by medicine.  Chest, back, or muscle pain.  People around you feel you are not acting correctly or are confused.  Shortness of breath or difficulty  breathing.  Dizziness and fainting.  You get a rash or develop hives.  You have a decrease in urine output.  Your urine turns a dark color or changes to pink, red, or brown. Any of the following symptoms occur over the next 10 days:  You have a temperature by mouth above 102 F (38.9 C), not controlled by medicine.  Shortness of breath.  Weakness after normal activity.  The white part of the eye turns yellow (jaundice).  You have a decrease in the amount of urine or are urinating less often.  Your urine turns a dark color or changes to pink, red, or brown. Document Released: 12/28/1999 Document Revised: 03/24/2011 Document Reviewed: 08/16/2007 [facility] Patient Information 2014 [place], [place].  _______________________________________________________________________

## 2018-08-07 LAB — SARS CORONAVIRUS 2 (TAT 6-24 HRS): SARS Coronavirus 2: NEGATIVE

## 2018-08-09 MED ORDER — BUPIVACAINE LIPOSOME 1.3 % IJ SUSP
10.0000 mL | Freq: Once | INTRAMUSCULAR | Status: DC
  Filled 2018-08-09: qty 10

## 2018-08-09 MED ORDER — TRANEXAMIC ACID 1000 MG/10ML IV SOLN
2000.0000 mg | INTRAVENOUS | Status: DC
  Filled 2018-08-09: qty 20

## 2018-08-09 NOTE — H&P (Signed)
TOTAL HIP ADMISSION H&P  Patient is admitted for right total hip arthroplasty.  Subjective:  Chief Complaint: right hip pain  HPI: Travis Evans, 58 y.o. male, has a history of pain and functional disability in the right hip(s) due to arthritis and patient has failed non-surgical conservative treatments for greater than 12 weeks to include NSAID's and/or analgesics, flexibility and strengthening excercises, supervised PT with diminished ADL's post treatment, use of assistive devices, weight reduction as appropriate and activity modification.  Onset of symptoms was gradual starting 5 years ago with gradually worsening course since that time.The patient noted no past surgery on the right hip(s).  Patient currently rates pain in the right hip at 10 out of 10 with activity. Patient has night pain, worsening of pain with activity and weight bearing, trendelenberg gait, pain that interfers with activities of daily living and crepitus. Patient has evidence of subchondral cysts, subchondral sclerosis, periarticular osteophytes and joint space narrowing by imaging studies. This condition presents safety issues increasing the risk of falls. There is no current active infection.  Patient Active Problem List   Diagnosis Date Noted  . Primary osteoarthritis of left hip 02/03/2017  . Abnormal nuclear stress test   . Chest tightness    Past Medical History:  Diagnosis Date  . 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 Complication of anesthesia    difficulty waking x 1  . DJD (degenerative joint disease)    left hip  . Full dentures   . Gastroesophageal reflux   . Lower back injury   . Peptic ulcer   . Sleep apnea    wears CPAP set on 8  . Thyroid disease     Past Surgical History:  Procedure Laterality Date  . CARPAL TUNNEL RELEASE    . COLONOSCOPY WITH ESOPHAGOGASTRODUODENOSCOPY (EGD)    . CYST REMOVAL NECK    .  EYE SURGERY     cornea surgery  . JOINT REPLACEMENT    . LEFT HEART CATH AND CORONARY ANGIOGRAPHY N/A 04/07/2016   Procedure: Left Heart Cath and Coronary Angiography;  Surgeon: Troy Sine, MD;  Location: [place] [facility];  Service: Cardiovascular;  Laterality: N/A;  . MULTIPLE TOOTH EXTRACTIONS    . TONSILLECTOMY    . TOTAL HIP ARTHROPLASTY Left 02/03/2017   Procedure: TOTAL HIP ARTHROPLASTY ANTERIOR APPROACH;  Surgeon: Melrose Nakayama, MD;  Location: [place];  Service: Orthopedics;  Laterality: Left;    Current Facility-Administered Medications  Medication Dose Route Frequency Provider Last Rate Last Dose  . [START ON 08/10/2018] bupivacaine liposome (EXPAREL) 1.3 % injection 133 mg  10 mL Other Once Melrose Nakayama, MD      . Derrill Memo ON 08/10/2018] tranexamic acid (CYKLOKAPRON) 2,000 mg in sodium chloride 0.9 % 50 mL Topical Application  9,604 mg Topical To OR Melrose Nakayama, MD       Current Outpatient Medications  Medication Sig Dispense Refill Last Dose  . Ascorbic Acid (VITAMIN C) 1000 MG tablet Take 1,000 mg by mouth daily.     Marland Kitchen azelastine (ASTELIN) 0.1 % nasal spray Place 1 spray into both nostrils daily as needed for allergies. Use in each nostril as directed      . EPINEPHrine (EPIPEN 2-PAK) 0.3 mg/0.3 mL IJ SOAJ injection Inject 0.3 mg into the muscle once as needed (allergic reaction).     Marland Kitchen ibuprofen (ADVIL) 200 MG tablet Take 400-800 mg  by mouth every 6 (six) hours as needed for headache or moderate pain.     . Ibuprofen-diphenhydrAMINE HCl (ADVIL PM) 200-25 MG CAPS Take 2 tablets by mouth at bedtime.     Marland Kitchen levothyroxine (SYNTHROID, LEVOTHROID) 50 MCG tablet Take 50 mcg by mouth at bedtime.      Marland Kitchen loratadine (CLARITIN) 10 MG tablet Take 10 mg by mouth at bedtime.      . Multiple Vitamin (MULTIVITAMIN WITH MINERALS) TABS tablet Take 1 tablet by mouth daily.     . pantoprazole (PROTONIX) 40 MG tablet Take 40 mg by mouth at bedtime.      . White Petrolatum-Mineral Oil  (LUBRICANT EYE NIGHTTIME) OINT Place 1 application into the right eye at bedtime.     . saw palmetto 80 MG capsule Take 80 mg by mouth daily. 2 pills      Allergies  Allergen Reactions  . Hibiclens [Chlorhexidine]     Itching and rash    Social History   Tobacco Use  . Smoking status: Former Smoker    Packs/day: 3.00    Types: Cigarettes    Quit date: 04/03/2012    Years since quitting: 6.3  . Smokeless tobacco: Former Systems developer    Types: Chew  Substance Use Topics  . Alcohol use: Yes    Comment: occasional beer    Family History  Problem Relation Age of Onset  . Liver cancer Mother   . Stroke Father      Review of Systems  Musculoskeletal: Positive for joint pain.       Right hip  All other systems reviewed and are negative.   Objective:  Physical Exam  Constitutional: He is oriented to person, place, and time. He appears well-developed and well-nourished.  HENT:  Head: Normocephalic and atraumatic.  Eyes: Pupils are equal, round, and reactive to light.  Neck: Normal range of motion.  Cardiovascular: Normal rate and regular rhythm.  Respiratory: Effort normal.  GI: Soft.  Musculoskeletal:     Comments: Examination right hip shows limited range of motion with significant pain with and internal range of motion.  Normal sensation throughout his leg.  He is neurovascularly intact distally.  Neurological: He is alert and oriented to person, place, and time.  Skin: Skin is warm and dry.  Psychiatric: He has a normal mood and affect. His behavior is normal. Judgment and thought content normal.    Vital signs in last 24 hours:    Labs:   Estimated body mass index is 24.32 kg/m as calculated from the following:   Height as of 08/06/18: 5\' 10"  (1.778 m).   Weight as of 08/06/18: 76.9 kg.   Imaging Review Plain radiographs demonstrate severe degenerative joint disease of the right hip(s). The bone quality appears to be good for age and reported activity  level.      Assessment/Plan:  End stage arthritis, right hip(s)  The patient history, physical examination, clinical judgement of the provider and imaging studies are consistent with end stage degenerative joint disease of the right hip(s) and total hip arthroplasty is deemed medically necessary. The treatment options including medical management, injection therapy, arthroscopy and arthroplasty were discussed at length. The risks and benefits of total hip arthroplasty were presented and reviewed. The risks due to aseptic loosening, infection, stiffness, dislocation/subluxation,  thromboembolic complications and other imponderables were discussed.  The patient acknowledged the explanation, agreed to proceed with the plan and consent was signed. Patient is being admitted for inpatient treatment for surgery,  pain control, PT, OT, prophylactic antibiotics, VTE prophylaxis, progressive ambulation and ADL's and discharge planning.The patient is planning to be discharged home with home health services

## 2018-08-10 ENCOUNTER — Ambulatory Visit (HOSPITAL_COMMUNITY): Payer: BC Managed Care – PPO | Admitting: Anesthesiology

## 2018-08-10 ENCOUNTER — Other Ambulatory Visit: Payer: Self-pay

## 2018-08-10 ENCOUNTER — Encounter (HOSPITAL_COMMUNITY): Payer: Self-pay | Admitting: Emergency Medicine

## 2018-08-10 ENCOUNTER — Encounter (HOSPITAL_COMMUNITY)
Admission: RE | Disposition: A | Discharge status: Home / Self Care | Payer: Self-pay | Source: Other Acute Inpatient Hospital | Attending: Orthopaedic Surgery

## 2018-08-10 ENCOUNTER — Ambulatory Visit (HOSPITAL_COMMUNITY): Payer: BC Managed Care – PPO | Admitting: Physician Assistant

## 2018-08-10 ENCOUNTER — Observation Stay (HOSPITAL_COMMUNITY)
Admission: RE | Admit: 2018-08-10 | Discharge: 2018-08-11 | Disposition: A | Discharge status: Home / Self Care | Payer: BC Managed Care – PPO | Source: Other Acute Inpatient Hospital | Attending: Orthopaedic Surgery | Admitting: Orthopaedic Surgery

## 2018-08-10 ENCOUNTER — Ambulatory Visit (HOSPITAL_COMMUNITY): Payer: BC Managed Care – PPO

## 2018-08-10 DIAGNOSIS — E079 Disorder of thyroid, unspecified: Secondary | ICD-10-CM | POA: Diagnosis not present

## 2018-08-10 DIAGNOSIS — K219 Gastro-esophageal reflux disease without esophagitis: Secondary | ICD-10-CM | POA: Diagnosis not present

## 2018-08-10 DIAGNOSIS — M1611 Unilateral primary osteoarthritis, right hip: Secondary | ICD-10-CM | POA: Diagnosis present

## 2018-08-10 DIAGNOSIS — M25551 Pain in right hip: Secondary | ICD-10-CM

## 2018-08-10 DIAGNOSIS — G4730 Sleep apnea, unspecified: Secondary | ICD-10-CM | POA: Insufficient documentation

## 2018-08-10 DIAGNOSIS — Z96642 Presence of left artificial hip joint: Secondary | ICD-10-CM | POA: Diagnosis not present

## 2018-08-10 DIAGNOSIS — Z79899 Other long term (current) drug therapy: Secondary | ICD-10-CM | POA: Diagnosis not present

## 2018-08-10 DIAGNOSIS — Z01818 Encounter for other preprocedural examination: Secondary | ICD-10-CM

## 2018-08-10 DIAGNOSIS — Z79890 Hormone replacement therapy (postmenopausal): Secondary | ICD-10-CM | POA: Diagnosis not present

## 2018-08-10 DIAGNOSIS — Z87891 Personal history of nicotine dependence: Secondary | ICD-10-CM | POA: Diagnosis not present

## 2018-08-10 HISTORY — PX: TOTAL HIP ARTHROPLASTY: SHX124

## 2018-08-10 LAB — TYPE AND SCREEN
ABO/RH(D): O POS
Antibody Screen: NEGATIVE

## 2018-08-10 SURGERY — ARTHROPLASTY, HIP, TOTAL, ANTERIOR APPROACH
Anesthesia: Spinal | Laterality: Right

## 2018-08-10 MED ORDER — DIPHENHYDRAMINE HCL 12.5 MG/5ML PO ELIX: 12.5000 mg | ORAL_SOLUTION | ORAL | Status: DC | PRN

## 2018-08-10 MED ORDER — 0.9 % SODIUM CHLORIDE (POUR BTL) OPTIME
TOPICAL | Status: DC | PRN
  Administered 2018-08-10: 1000 mL

## 2018-08-10 MED ORDER — FENTANYL CITRATE (PF) 100 MCG/2ML IJ SOLN: 25.0000 ug | INTRAMUSCULAR | Status: DC | PRN

## 2018-08-10 MED ORDER — EPHEDRINE 5 MG/ML INJ
INTRAVENOUS | Status: AC
  Filled 2018-08-10: qty 10

## 2018-08-10 MED ORDER — BUPIVACAINE-EPINEPHRINE (PF) 0.25% -1:200000 IJ SOLN
INTRAMUSCULAR | Status: AC
  Filled 2018-08-10: qty 30

## 2018-08-10 MED ORDER — LACTATED RINGERS IV SOLN
INTRAVENOUS | Status: DC
  Administered 2018-08-10 (×2): via INTRAVENOUS

## 2018-08-10 MED ORDER — POVIDONE-IODINE 10 % EX SWAB
2.0000 "application " | Freq: Once | CUTANEOUS | Status: AC
  Administered 2018-08-10: 2 via TOPICAL

## 2018-08-10 MED ORDER — FENTANYL CITRATE (PF) 100 MCG/2ML IJ SOLN
INTRAMUSCULAR | Status: AC
  Filled 2018-08-10: qty 2

## 2018-08-10 MED ORDER — PHENOL 1.4 % MT LIQD: 1.0000 | OROMUCOSAL | Status: DC | PRN

## 2018-08-10 MED ORDER — METHOCARBAMOL 500 MG PO TABS
500.0000 mg | ORAL_TABLET | Freq: Four times a day (QID) | ORAL | Status: DC | PRN
  Administered 2018-08-11: 500 mg via ORAL

## 2018-08-10 MED ORDER — LACTATED RINGERS IV SOLN
INTRAVENOUS | Status: DC
  Administered 2018-08-10 (×2): via INTRAVENOUS

## 2018-08-10 MED ORDER — EPINEPHRINE 0.3 MG/0.3ML IJ SOAJ
0.3000 mg | Freq: Once | INTRAMUSCULAR | Status: DC | PRN
  Filled 2018-08-10: qty 0.6

## 2018-08-10 MED ORDER — CHLORHEXIDINE GLUCONATE 4 % EX LIQD: 60.0000 mL | Freq: Once | CUTANEOUS | Status: DC

## 2018-08-10 MED ORDER — TRANEXAMIC ACID-NACL 1000-0.7 MG/100ML-% IV SOLN
1000.0000 mg | Freq: Once | INTRAVENOUS | Status: AC
  Administered 2018-08-10: 1000 mg via INTRAVENOUS
  Filled 2018-08-10: qty 100

## 2018-08-10 MED ORDER — DEXAMETHASONE SODIUM PHOSPHATE 10 MG/ML IJ SOLN
INTRAMUSCULAR | Status: AC
  Filled 2018-08-10: qty 1

## 2018-08-10 MED ORDER — ALUM & MAG HYDROXIDE-SIMETH 200-200-20 MG/5ML PO SUSP: 30.0000 mL | ORAL | Status: DC | PRN

## 2018-08-10 MED ORDER — LIDOCAINE 2% (20 MG/ML) 5 ML SYRINGE
INTRAMUSCULAR | Status: AC
  Filled 2018-08-10: qty 5

## 2018-08-10 MED ORDER — PROPOFOL 10 MG/ML IV BOLUS
INTRAVENOUS | Status: AC
  Filled 2018-08-10: qty 20

## 2018-08-10 MED ORDER — LORATADINE 10 MG PO TABS
10.0000 mg | ORAL_TABLET | Freq: Every day | ORAL | Status: DC
  Administered 2018-08-10: 10 mg via ORAL
  Filled 2018-08-10: qty 1

## 2018-08-10 MED ORDER — DEXAMETHASONE SODIUM PHOSPHATE 4 MG/ML IJ SOLN
INTRAMUSCULAR | Status: DC | PRN
  Administered 2018-08-10: 10 mg via INTRAVENOUS

## 2018-08-10 MED ORDER — METHOCARBAMOL 500 MG IVPB - SIMPLE MED
INTRAVENOUS | Status: AC
  Filled 2018-08-10: qty 50

## 2018-08-10 MED ORDER — ASPIRIN 81 MG PO CHEW
81.0000 mg | CHEWABLE_TABLET | Freq: Two times a day (BID) | ORAL | Status: DC
  Administered 2018-08-10 – 2018-08-11 (×2): 81 mg via ORAL
  Filled 2018-08-10 (×2): qty 1

## 2018-08-10 MED ORDER — MORPHINE SULFATE (PF) 2 MG/ML IV SOLN: 0.5000 mg | INTRAVENOUS | Status: DC | PRN

## 2018-08-10 MED ORDER — EPHEDRINE SULFATE 50 MG/ML IJ SOLN
INTRAMUSCULAR | Status: DC | PRN
  Administered 2018-08-10: 5 mg via INTRAVENOUS
  Administered 2018-08-10 (×2): 10 mg via INTRAVENOUS

## 2018-08-10 MED ORDER — TRANEXAMIC ACID-NACL 1000-0.7 MG/100ML-% IV SOLN
1000.0000 mg | INTRAVENOUS | Status: AC
  Administered 2018-08-10: 10:00:00 1000 mg via INTRAVENOUS
  Filled 2018-08-10: qty 100

## 2018-08-10 MED ORDER — BUPIVACAINE-EPINEPHRINE (PF) 0.25% -1:200000 IJ SOLN
INTRAMUSCULAR | Status: DC | PRN
  Administered 2018-08-10: 30 mL

## 2018-08-10 MED ORDER — ACETAMINOPHEN 325 MG PO TABS: 325.0000 mg | ORAL_TABLET | Freq: Four times a day (QID) | ORAL | Status: DC | PRN

## 2018-08-10 MED ORDER — MIDAZOLAM HCL 5 MG/5ML IJ SOLN
INTRAMUSCULAR | Status: DC | PRN
  Administered 2018-08-10: 2 mg via INTRAVENOUS

## 2018-08-10 MED ORDER — HYDROCODONE-ACETAMINOPHEN 7.5-325 MG PO TABS: 1.0000 | ORAL_TABLET | ORAL | Status: DC | PRN

## 2018-08-10 MED ORDER — PROPOFOL 10 MG/ML IV BOLUS
INTRAVENOUS | Status: DC | PRN
  Administered 2018-08-10: 10 mg via INTRAVENOUS
  Administered 2018-08-10 (×2): 20 mg via INTRAVENOUS

## 2018-08-10 MED ORDER — ONDANSETRON HCL 4 MG/2ML IJ SOLN: 4.0000 mg | Freq: Four times a day (QID) | INTRAMUSCULAR | Status: DC | PRN

## 2018-08-10 MED ORDER — BISACODYL 5 MG PO TBEC: 5.0000 mg | DELAYED_RELEASE_TABLET | Freq: Every day | ORAL | Status: DC | PRN

## 2018-08-10 MED ORDER — METHOCARBAMOL 500 MG IVPB - SIMPLE MED
500.0000 mg | Freq: Four times a day (QID) | INTRAVENOUS | Status: DC | PRN
  Administered 2018-08-10: 500 mg via INTRAVENOUS
  Filled 2018-08-10: qty 50

## 2018-08-10 MED ORDER — FENTANYL CITRATE (PF) 100 MCG/2ML IJ SOLN
INTRAMUSCULAR | Status: DC | PRN
  Administered 2018-08-10 (×2): 50 ug via INTRAVENOUS

## 2018-08-10 MED ORDER — LEVOTHYROXINE SODIUM 50 MCG PO TABS
50.0000 ug | ORAL_TABLET | Freq: Every day | ORAL | Status: DC
  Administered 2018-08-10: 50 ug via ORAL
  Filled 2018-08-10: qty 1

## 2018-08-10 MED ORDER — MENTHOL 3 MG MT LOZG: 1.0000 | LOZENGE | OROMUCOSAL | Status: DC | PRN

## 2018-08-10 MED ORDER — TRANEXAMIC ACID 1000 MG/10ML IV SOLN
INTRAVENOUS | Status: DC | PRN
  Administered 2018-08-10: 11:00:00 2000 mg via TOPICAL

## 2018-08-10 MED ORDER — ONDANSETRON HCL 4 MG/2ML IJ SOLN
INTRAMUSCULAR | Status: DC | PRN
  Administered 2018-08-10: 4 mg via INTRAVENOUS

## 2018-08-10 MED ORDER — CEFAZOLIN SODIUM-DEXTROSE 2-4 GM/100ML-% IV SOLN
2.0000 g | Freq: Four times a day (QID) | INTRAVENOUS | Status: AC
  Administered 2018-08-10 (×2): 2 g via INTRAVENOUS
  Filled 2018-08-10 (×2): qty 100

## 2018-08-10 MED ORDER — ACETAMINOPHEN 500 MG PO TABS
500.0000 mg | ORAL_TABLET | Freq: Four times a day (QID) | ORAL | Status: DC
  Administered 2018-08-11 (×2): 500 mg via ORAL
  Filled 2018-08-10 (×2): qty 1

## 2018-08-10 MED ORDER — METOCLOPRAMIDE HCL 5 MG/ML IJ SOLN: 5.0000 mg | Freq: Three times a day (TID) | INTRAMUSCULAR | Status: DC | PRN

## 2018-08-10 MED ORDER — DOCUSATE SODIUM 100 MG PO CAPS
100.0000 mg | ORAL_CAPSULE | Freq: Two times a day (BID) | ORAL | Status: DC
  Administered 2018-08-10 – 2018-08-11 (×2): 100 mg via ORAL
  Filled 2018-08-10 (×2): qty 1

## 2018-08-10 MED ORDER — CEFAZOLIN SODIUM-DEXTROSE 2-4 GM/100ML-% IV SOLN
2.0000 g | INTRAVENOUS | Status: AC
  Administered 2018-08-10: 10:00:00 2 g via INTRAVENOUS
  Filled 2018-08-10: qty 100

## 2018-08-10 MED ORDER — PROPOFOL 500 MG/50ML IV EMUL
INTRAVENOUS | Status: DC | PRN
  Administered 2018-08-10: 100 ug/kg/min via INTRAVENOUS

## 2018-08-10 MED ORDER — BUPIVACAINE LIPOSOME 1.3 % IJ SUSP
INTRAMUSCULAR | Status: DC | PRN
  Administered 2018-08-10: 10 mL

## 2018-08-10 MED ORDER — ACETAMINOPHEN 500 MG PO TABS
1000.0000 mg | ORAL_TABLET | Freq: Once | ORAL | Status: AC
  Administered 2018-08-10: 1000 mg via ORAL
  Filled 2018-08-10: qty 2

## 2018-08-10 MED ORDER — STERILE WATER FOR IRRIGATION IR SOLN
Status: DC | PRN
  Administered 2018-08-10: 2000 mL

## 2018-08-10 MED ORDER — ONDANSETRON HCL 4 MG/2ML IJ SOLN
INTRAMUSCULAR | Status: AC
  Filled 2018-08-10: qty 2

## 2018-08-10 MED ORDER — MIDAZOLAM HCL 2 MG/2ML IJ SOLN
INTRAMUSCULAR | Status: AC
  Filled 2018-08-10: qty 2

## 2018-08-10 MED ORDER — PROPOFOL 10 MG/ML IV BOLUS
INTRAVENOUS | Status: AC
  Filled 2018-08-10: qty 40

## 2018-08-10 MED ORDER — LIDOCAINE HCL (CARDIAC) PF 100 MG/5ML IV SOSY
PREFILLED_SYRINGE | INTRAVENOUS | Status: DC | PRN
  Administered 2018-08-10: 30 mg via INTRAVENOUS

## 2018-08-10 MED ORDER — METOCLOPRAMIDE HCL 5 MG PO TABS: 5.0000 mg | ORAL_TABLET | Freq: Three times a day (TID) | ORAL | Status: DC | PRN

## 2018-08-10 MED ORDER — PANTOPRAZOLE SODIUM 40 MG PO TBEC
40.0000 mg | DELAYED_RELEASE_TABLET | Freq: Every day | ORAL | Status: DC
  Administered 2018-08-10: 40 mg via ORAL
  Filled 2018-08-10: qty 1

## 2018-08-10 MED ORDER — KETOROLAC TROMETHAMINE 15 MG/ML IJ SOLN
15.0000 mg | Freq: Four times a day (QID) | INTRAMUSCULAR | Status: AC
  Administered 2018-08-10 – 2018-08-11 (×4): 15 mg via INTRAVENOUS
  Filled 2018-08-10 (×4): qty 1

## 2018-08-10 MED ORDER — HYDROCODONE-ACETAMINOPHEN 5-325 MG PO TABS
1.0000 | ORAL_TABLET | ORAL | Status: DC | PRN
  Administered 2018-08-10 (×2): 2 via ORAL
  Administered 2018-08-10 – 2018-08-11 (×2): 1 via ORAL
  Administered 2018-08-11: 2 via ORAL
  Filled 2018-08-10: qty 1
  Filled 2018-08-10: qty 2
  Filled 2018-08-10: qty 1
  Filled 2018-08-10 (×2): qty 2

## 2018-08-10 MED ORDER — ONDANSETRON HCL 4 MG PO TABS: 4.0000 mg | ORAL_TABLET | Freq: Four times a day (QID) | ORAL | Status: DC | PRN

## 2018-08-10 SURGICAL SUPPLY — 45 items
BAG DECANTER FOR FLEXI CONT (MISCELLANEOUS) ×3 IMPLANT
BLADE SAW SGTL 18X1.27X75 (BLADE) ×2 IMPLANT
BLADE SAW SGTL 18X1.27X75MM (BLADE) ×1
BLADE SURG SZ10 CARB STEEL (BLADE) ×2 IMPLANT
CELLS DAT CNTRL 66122 CELL SVR (MISCELLANEOUS) ×1 IMPLANT
COVER PERINEAL POST (MISCELLANEOUS) ×3 IMPLANT
COVER SURGICAL LIGHT HANDLE (MISCELLANEOUS) ×3 IMPLANT
COVER WAND RF STERILE (DRAPES) ×2 IMPLANT
CUP PINN GRIPTON 54 100 (Cup) ×2 IMPLANT
DECANTER SPIKE VIAL GLASS SM (MISCELLANEOUS) ×3 IMPLANT
DRAPE IMP U-DRAPE 54X76 (DRAPES) ×3 IMPLANT
DRAPE STERI IOBAN 125X83 (DRAPES) ×3 IMPLANT
DRAPE U-SHAPE 47X51 STRL (DRAPES) ×6 IMPLANT
DRESSING AQUACEL AG SP 3.5X10 (GAUZE/BANDAGES/DRESSINGS) IMPLANT
DRSG AQUACEL AG ADV 3.5X10 (GAUZE/BANDAGES/DRESSINGS) ×3 IMPLANT
DRSG AQUACEL AG SP 3.5X10 (GAUZE/BANDAGES/DRESSINGS) ×3
DURAPREP 26ML APPLICATOR (WOUND CARE) ×3 IMPLANT
ELECT BLADE TIP CTD 4 INCH (ELECTRODE) ×3 IMPLANT
ELECT REM PT RETURN 15FT ADLT (MISCELLANEOUS) ×3 IMPLANT
ELIMINATOR HOLE APEX DEPUY (Hips) ×2 IMPLANT
GLOVE BIO SURGEON STRL SZ8 (GLOVE) ×6 IMPLANT
GLOVE BIOGEL PI IND STRL 8 (GLOVE) ×2 IMPLANT
GLOVE BIOGEL PI INDICATOR 8 (GLOVE) ×4
GOWN STRL REUS W/TWL XL LVL3 (GOWN DISPOSABLE) ×6 IMPLANT
HEAD CERAMIC DELTA 36 PLUS 1.5 (Hips) ×2 IMPLANT
HOLDER FOLEY CATH W/STRAP (MISCELLANEOUS) ×3 IMPLANT
KIT TURNOVER KIT A (KITS) IMPLANT
LINER NEUTRAL 54X36MM PLUS 4 (Hips) ×2 IMPLANT
MANIFOLD NEPTUNE II (INSTRUMENTS) ×3 IMPLANT
NS IRRIG 1000ML POUR BTL (IV SOLUTION) ×3 IMPLANT
PACK ANTERIOR HIP CUSTOM (KITS) ×3 IMPLANT
PROTECTOR NERVE ULNAR (MISCELLANEOUS) ×3 IMPLANT
RETRACTOR WND ALEXIS 18 MED (MISCELLANEOUS) ×1 IMPLANT
RTRCTR WOUND ALEXIS 18CM MED (MISCELLANEOUS) ×3
STEM FEM ACTIS STD SZ7 (Nail) ×2 IMPLANT
SUT ETHIBOND NAB CT1 #1 30IN (SUTURE) ×6 IMPLANT
SUT VIC AB 1 CT1 36 (SUTURE) ×3 IMPLANT
SUT VIC AB 2-0 CT1 27 (SUTURE) ×2
SUT VIC AB 2-0 CT1 TAPERPNT 27 (SUTURE) ×1 IMPLANT
SUT VIC AB 3-0 PS2 18 (SUTURE) ×2
SUT VIC AB 3-0 PS2 18XBRD (SUTURE) ×1 IMPLANT
SUT VLOC 180 0 24IN GS25 (SUTURE) ×3 IMPLANT
SYR 50ML LL SCALE MARK (SYRINGE) ×1 IMPLANT
TRAY FOLEY MTR SLVR 16FR STAT (SET/KITS/TRAYS/PACK) ×3 IMPLANT
YANKAUER SUCT BULB TIP 10FT TU (MISCELLANEOUS) ×3 IMPLANT

## 2018-08-10 NOTE — Evaluation (Signed)
Physical Therapy Evaluation Patient Details Name: Travis Evans MRN: [id number] DOB: 1960/10/04 Today's Date: 08/10/2018   History of Present Illness  R DA-THA; PMH L THA January 2019  Clinical Impression  Pt is s/p THA resulting in the deficits listed below (see PT Problem List). Pt ambulated 200' with RW, no loss of balance. Initiated THA HEP. Pt is highly motivated, excellent progress expected.  Pt will benefit from skilled PT to increase their independence and safety with mobility to allow discharge to the venue listed below.      Follow Up Recommendations Follow surgeon's recommendation for DC plan and follow-up therapies    Equipment Recommendations  None recommended by PT    Recommendations for Other Services       Precautions / Restrictions Precautions Precautions: Fall Restrictions Weight Bearing Restrictions: No RLE Weight Bearing: Weight bearing as tolerated      Mobility  Bed Mobility Overal bed mobility: Modified Independent             General bed mobility comments: HOB up, increased time to advance RLE  Transfers Overall transfer level: Needs assistance Equipment used: Rolling walker (2 wheeled) Transfers: Sit to/from Stand Sit to Stand: Min guard         General transfer comment: VCs hand placement, min/guard for safety  Ambulation/Gait Ambulation/Gait assistance: Min guard Gait Distance (Feet): 250 Feet Assistive device: Rolling walker (2 wheeled) Gait Pattern/deviations: Step-through pattern Gait velocity: WNL   General Gait Details: VCs sequencing  Stairs            Wheelchair Mobility    Modified Rankin (Stroke Patients Only)       Balance Overall balance assessment: Modified Independent                                           Pertinent Vitals/Pain Pain Assessment: 0-10 Pain Score: 3  Pain Location: R hip Pain Descriptors / Indicators: Sore Pain Intervention(s): Monitored during  session;Limited activity within patient's tolerance;Premedicated before session;Ice applied    Home Living Family/patient expects to be discharged to:: Private residence Living Arrangements: Spouse/significant other Available Help at Discharge: Family;Available 24 hours/day Type of Home: House Home Access: Stairs to enter   [employer] of Steps: 2 Home Layout: One level Home Equipment: Walker - 2 wheels      Prior Function Level of Independence: Independent               Hand Dominance        Extremity/Trunk Assessment   Upper Extremity Assessment Upper Extremity Assessment: Overall WFL for tasks assessed    Lower Extremity Assessment Lower Extremity Assessment: RLE deficits/detail RLE Deficits / Details: hip AAROM decr ~40% 2* pain, knee ext at least 3/5 RLE Sensation: WNL    Cervical / Trunk Assessment Cervical / Trunk Assessment: Normal  Communication   Communication: No difficulties  Cognition Arousal/Alertness: Awake/alert Behavior During Therapy: WFL for tasks assessed/performed Overall Cognitive Status: Within Functional Limits for tasks assessed                                        General Comments      Exercises Total Joint Exercises Ankle Circles/Pumps: AROM;Both;10 reps Hip ABduction/ADduction: AAROM;Right;10 reps;Supine Straight Leg Raises: AAROM;Right;10 reps;Supine   Assessment/Plan    PT  Assessment Patient needs continued PT services  PT Problem List Decreased strength;Decreased range of motion;Pain;Decreased mobility;Decreased activity tolerance       PT Treatment Interventions DME instruction;Gait training;Functional mobility training;Stair training;Therapeutic exercise;Therapeutic activities    PT Goals (Current goals can be found in the Care Plan section)  Acute Rehab PT Goals 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, return to work PT Goal Formulation: With patient Time For Goal Achievement:  08/17/18 Potential to Achieve Goals: Good    Frequency 7X/week   Barriers to discharge        Co-evaluation               AM-PAC PT "6 Clicks" Mobility  Outcome Measure Help needed turning from your back to your side while in a flat bed without using bedrails?: A Little Help needed moving from lying on your back to sitting on the side of a flat bed without using bedrails?: A Little Help needed moving to and from a bed to a chair (including a wheelchair)?: A Little Help needed standing up from a chair using your arms (e.g., wheelchair or bedside chair)?: A Little Help needed to walk in hospital room?: A Little Help needed climbing 3-5 steps with a railing? : A Lot 6 Click Score: 17    End of Session Equipment Utilized During Treatment: Gait belt Activity Tolerance: Patient tolerated treatment well Patient left: in chair;with call bell/phone within reach Nurse Communication: Mobility status PT Visit Diagnosis: Difficulty in walking, not elsewhere classified (R26.2);Pain Pain - Right/Left: Right Pain - part of body: Hip    Time: 4920-1007 PT Time Calculation (min) (ACUTE ONLY): 27 min   Charges:   PT Evaluation $PT Eval Low Complexity: 1 Low PT Treatments $Gait Training: 8-22 mins        Blondell Reveal Kistler PT 08/10/2018  Acute Rehabilitation Services Pager [phone] Office [phone]

## 2018-08-10 NOTE — Anesthesia Preprocedure Evaluation (Addendum)
Anesthesia Evaluation  Patient identified by MRN, date of birth, ID band Patient awake    Reviewed: Allergy & Precautions, NPO status , Patient's Chart, lab work & pertinent test results  Airway Mallampati: I  TM Distance: >3 FB Neck ROM: Full    Dental no notable dental hx. (+) Edentulous Upper, Edentulous Lower, Upper Dentures, Lower Dentures   Pulmonary sleep apnea and Continuous Positive Airway Pressure Ventilation , former smoker,    Pulmonary exam normal breath sounds clear to auscultation       Cardiovascular negative cardio ROS Normal cardiovascular exam Rhythm:Regular Rate:Normal  Known RBBB and LAFB  LHC 2018 Normal LV function. Essentially normal coronary arteries with very minimal luminal irregularity of the LAD without obstructive disease.   Neuro/Psych negative neurological ROS  negative psych ROS   GI/Hepatic Neg liver ROS, PUD, GERD  ,  Endo/Other  negative endocrine ROS  Renal/GU negative Renal ROS  negative genitourinary   Musculoskeletal  (+) Arthritis , Osteoarthritis,    Abdominal   Peds  Hematology negative hematology ROS (+)   Anesthesia Other Findings   Reproductive/Obstetrics                            Anesthesia Physical Anesthesia Plan  ASA: III  Anesthesia Plan: Spinal   Post-op Pain Management:    Induction: Intravenous  PONV Risk Score and Plan: Treatment may vary due to age or medical condition, Propofol infusion, Midazolam, Ondansetron and Dexamethasone  Airway Management Planned: Natural Airway  Additional Equipment:   Intra-op Plan:   Post-operative Plan:   Informed Consent: I have reviewed the patients History and Physical, chart, labs and discussed the procedure including the risks, benefits and alternatives for the proposed anesthesia with the patient or authorized representative who has indicated his/her understanding and acceptance.      Dental advisory given  Plan Discussed with: CRNA  Anesthesia Plan Comments:         Anesthesia Quick Evaluation

## 2018-08-10 NOTE — Interval H&P Note (Signed)
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  The various methods of treatment have been discussed with the patient and family. After consideration of risks, benefits and other options for treatment, the patient has consented to  Procedure(s): TOTAL HIP ARTHROPLASTY ANTERIOR APPROACH (Right) as a surgical intervention.  The patient's history has been reviewed, patient examined, no change in status, stable for surgery.  I have reviewed the patient's chart and labs.  Questions were answered to the patient's satisfaction.     [name]

## 2018-08-10 NOTE — Transfer of Care (Signed)
Immediate Anesthesia Transfer of Care Note  Patient: Travis Evans  Procedure(s) Performed: TOTAL HIP ARTHROPLASTY ANTERIOR APPROACH (Right )  Patient Location: PACU  Anesthesia Type:Spinal  Level of Consciousness: awake, alert , oriented and patient cooperative  Airway & Oxygen Therapy: Patient Spontanous Breathing and Patient connected to face mask oxygen  Post-op Assessment: Report given to RN, Post -op Vital signs reviewed and stable and Patient moving all extremities  Post vital signs: Reviewed and stable  Last Vitals:  Vitals Value Taken Time  BP 121/66 08/10/18 1130  Temp    Pulse 57 08/10/18 1131  Resp 12 08/10/18 1131  SpO2 100 % 08/10/18 1131  Vitals shown include unvalidated device data.  Last Pain:  Vitals:   08/10/18 0734  TempSrc:   PainSc: 3       Patients Stated Pain Goal: 4 (95/18/84 1660)  Complications: No apparent anesthesia complications

## 2018-08-10 NOTE — Op Note (Signed)

## 2018-08-10 NOTE — Anesthesia Procedure Notes (Signed)
Spinal  Patient location during procedure: OR Start time: 08/10/2018 9:49 AM End time: 08/10/2018 9:53 AM Staffing Resident/CRNA: Garrel Ridgel, CRNA Performed: resident/CRNA  Preanesthetic Checklist Completed: patient identified, site marked, surgical consent, pre-op evaluation, timeout performed, IV checked, risks and benefits discussed and monitors and equipment checked Spinal Block Patient position: sitting Prep: Betadine Patient monitoring: heart rate, continuous pulse ox and blood pressure Approach: midline Location: L3-4 Injection technique: single-shot Needle Needle type: Pencan  Needle gauge: 24 G Needle length: 9 cm Needle insertion depth: 5 cm Assessment Sensory level: T10

## 2018-08-10 NOTE — Anesthesia Postprocedure Evaluation (Signed)
Anesthesia Post Note  Patient: CHRISTOPHER GLASSCOCK  Procedure(s) Performed: TOTAL HIP ARTHROPLASTY ANTERIOR APPROACH (Right )     Patient location during evaluation: PACU Anesthesia Type: Spinal Level of consciousness: oriented and awake and alert Pain management: pain level controlled Vital Signs Assessment: post-procedure vital signs reviewed and stable Respiratory status: spontaneous breathing, respiratory function stable and patient connected to nasal cannula oxygen Cardiovascular status: blood pressure returned to baseline and stable Postop Assessment: no headache, no backache and no apparent nausea or vomiting Anesthetic complications: no    Last Vitals:  Vitals:   08/10/18 1259 08/10/18 1431  BP: 119/89 133/74  Pulse: 62 73  Resp: 16 17  Temp: (!) 36.4 C 36.4 C  SpO2: 100% 100%    Last Pain:  Vitals:   08/10/18 1259  TempSrc: Oral  PainSc: 5                  Shalimar Mcclain L Mattia Liford

## 2018-08-11 ENCOUNTER — Encounter (HOSPITAL_COMMUNITY): Payer: Self-pay | Admitting: Orthopaedic Surgery

## 2018-08-11 DIAGNOSIS — M1611 Unilateral primary osteoarthritis, right hip: Secondary | ICD-10-CM | POA: Diagnosis not present

## 2018-08-11 MED ORDER — HYDROCODONE-ACETAMINOPHEN 5-325 MG PO TABS: 1.0000 | ORAL_TABLET | Freq: Four times a day (QID) | ORAL | 0 refills | Status: DC | PRN

## 2018-08-11 MED ORDER — TIZANIDINE HCL 4 MG PO TABS: 4.0000 mg | ORAL_TABLET | Freq: Four times a day (QID) | ORAL | 1 refills | Status: AC | PRN

## 2018-08-11 NOTE — Progress Notes (Signed)
Subjective: 1 Day Post-Op Procedure(s) (LRB): TOTAL HIP ARTHROPLASTY ANTERIOR APPROACH (Right)   Patient feels great and is looking forward to going home.   Activity level:  wbat Diet tolerance:  ok Voiding:  ok Patient reports pain as mild.    Objective: Vital signs in last 24 hours: Temp:  [96.8 F (36 C)-97.7 F (36.5 C)] 97.6 F (36.4 C) (07/29 0644) Pulse Rate:  [59-76] 68 (07/29 0644) Resp:  [11-17] 16 (07/29 0644) BP: (116-138)/(66-89) 121/69 (07/29 0644) SpO2:  [97 %-100 %] 97 % (07/29 0644)  Labs: No results for input(s): HGB in the last 72 hours. No results for input(s): WBC, RBC, HCT, PLT in the last 72 hours. No results for input(s): NA, K, CL, CO2, BUN, CREATININE, GLUCOSE, CALCIUM in the last 72 hours. No results for input(s): LABPT, INR in the last 72 hours.  Physical Exam:  Neurologically intact ABD soft Neurovascular intact Sensation intact distally Intact pulses distally Dorsiflexion/Plantar flexion intact Incision: dressing C/D/I and no drainage No cellulitis present Compartment soft  Assessment/Plan:  1 Day Post-Op Procedure(s) (LRB): TOTAL HIP ARTHROPLASTY ANTERIOR APPROACH (Right) Advance diet Up with therapy D/C IV fluids Discharge home with home health today after PT. Continue on ASA BID x 4 weeks for DVT prevention. Follow up in office 2 weeks post op.  [name] Urvi Imes 08/11/2018, 7:57 AM

## 2018-08-11 NOTE — Progress Notes (Signed)
Physical Therapy Treatment Patient Details Name: Travis Evans MRN: [id number] DOB: 07/28/1960 Today's Date: 08/11/2018    History of Present Illness R THA    PT Comments    Pt has met PT goals and is ready to DC home from PT standpoint. He ambulated 400' with RW, completed stair training, and demonstrates understanding of HEP.  Follow Up Recommendations  Follow surgeon's recommendation for DC plan and follow-up therapies     Equipment Recommendations  None recommended by PT    Recommendations for Other Services       Precautions / Restrictions Precautions Precautions: Fall Restrictions Weight Bearing Restrictions: No RLE Weight Bearing: Weight bearing as tolerated    Mobility  Bed Mobility Overal bed mobility: Independent             General bed mobility comments: HOB up, increased time to advance RLE  Transfers Overall transfer level: Needs assistance Equipment used: Rolling walker (2 wheeled) Transfers: Sit to/from Stand Sit to Stand: Modified independent (Device/Increase time)         General transfer comment: VCs hand placement  Ambulation/Gait Ambulation/Gait assistance: Modified independent (Device/Increase time) Gait Distance (Feet): 40 Feet Assistive device: Rolling walker (2 wheeled) Gait Pattern/deviations: WFL(Within Functional Limits) Gait velocity: WNL   General Gait Details: steady   Stairs Stairs: Yes Stairs assistance: Supervision Stair Management: Two rails;Forwards Number of Stairs: 3 General stair comments: VCs sequencing, 3 steps x 2 trials   Wheelchair Mobility    Modified Rankin (Stroke Patients Only)       Balance Overall balance assessment: Modified Independent                                          Cognition Arousal/Alertness: Awake/alert Behavior During Therapy: WFL for tasks assessed/performed Overall Cognitive Status: Within Functional Limits for tasks assessed                                         Exercises Total Joint Exercises Ankle Circles/Pumps: AROM;Both;10 reps Quad Sets: Right;5 reps;Supine Short Arc Quad: AROM;Right;10 reps;Supine Heel Slides: AAROM;Right;10 reps;Supine Hip ABduction/ADduction: Right;10 reps;Supine;AROM;Standing(x 10 in supine, x 10 standing) Long Arc Quad: AROM;Right;10 reps;Seated Knee Flexion: AROM;Right;10 reps;Standing Marching in Standing: AROM;Right;10 reps;Standing Standing Hip Extension: AROM;Right;10 reps;Standing    General Comments        Pertinent Vitals/Pain Pain Score: 5  Pain Location: R hip Pain Descriptors / Indicators: Sore Pain Intervention(s): Limited activity within patient's tolerance;Monitored during session;Premedicated before session    Home Living                      Prior Function            PT Goals (current goals can now be found in the care plan section) Acute Rehab PT Goals 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, return to work PT Goal Formulation: With patient Time For Goal Achievement: 08/17/18 Potential to Achieve Goals: Good Progress towards PT goals: Goals met/education completed, patient discharged from PT    Frequency    7X/week      PT Plan Current plan remains appropriate    Co-evaluation              AM-PAC PT "6 Clicks" Mobility   Outcome Measure  Help needed  turning from your back to your side while in a flat bed without using bedrails?: None Help needed moving from lying on your back to sitting on the side of a flat bed without using bedrails?: None Help needed moving to and from a bed to a chair (including a wheelchair)?: None Help needed standing up from a chair using your arms (e.g., wheelchair or bedside chair)?: None Help needed to walk in hospital room?: None Help needed climbing 3-5 steps with a railing? : None 6 Click Score: 24    End of Session Equipment Utilized During Treatment: Gait belt Activity Tolerance:  Patient tolerated treatment well Patient left: in chair;with call bell/phone within reach Nurse Communication: Mobility status PT Visit Diagnosis: Difficulty in walking, not elsewhere classified (R26.2);Pain Pain - Right/Left: Right Pain - part of body: Hip     Time: 6047-9987 PT Time Calculation (min) (ACUTE ONLY): 22 min  Charges:  $Gait Training: 8-22 mins                     Blondell Reveal Kistler PT 08/11/2018  Acute Rehabilitation Services Pager [phone] Office [phone]

## 2018-08-11 NOTE — Progress Notes (Signed)
Patient discharged to home w/ wife. Given all belongings, instructions, equipment. Verbalized understanding of all instructions. Escorted to pov via w/c. 

## 2018-08-11 NOTE — Discharge Summary (Signed)
Patient ID: Travis Evans MRN: [id number] DOB/AGE: Jun 10, 1960 58 y.o.  Admit date: 08/10/2018 Discharge date: 08/11/2018  Admission Diagnoses:  Principal Problem:   Primary osteoarthritis of right hip   Discharge Diagnoses:  Same  Past Medical History:  Diagnosis Date  . 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 Complication of anesthesia    difficulty waking x 1  . DJD (degenerative joint disease)    left hip  . Full dentures   . Gastroesophageal reflux   . Lower back injury   . Peptic ulcer   . Sleep apnea    wears CPAP set on 8  . Thyroid disease     Surgeries: Procedure(s): TOTAL HIP ARTHROPLASTY ANTERIOR APPROACH on 08/10/2018   Consultants:   Discharged Condition: Improved  Hospital Course: Travis Evans is an 58 y.o. male who was admitted 08/10/2018 for operative treatment ofPrimary osteoarthritis of right hip. Patient has severe unremitting pain that affects sleep, daily activities, and work/hobbies. After pre-op clearance the patient was taken to the operating room on 08/10/2018 and underwent  Procedure(s): TOTAL HIP ARTHROPLASTY ANTERIOR APPROACH.    Patient was given perioperative antibiotics:  Anti-infectives (From admission, onward)   Start     Dose/Rate Route Frequency Ordered Stop   08/10/18 1600  ceFAZolin (ANCEF) IVPB 2g/100 mL premix     2 g 200 mL/hr over 30 Minutes Intravenous Every 6 hours 08/10/18 1327 08/10/18 2325   08/10/18 0730  ceFAZolin (ANCEF) IVPB 2g/100 mL premix     2 g 200 mL/hr over 30 Minutes Intravenous On call to O.R. 08/10/18 0729 08/10/18 1003       Patient was given sequential compression devices, early ambulation, and chemoprophylaxis to prevent DVT.  Patient benefited maximally from hospital stay and there were no complications.    Recent vital signs:  Patient Vitals for the past 24 hrs:  BP Temp Temp src Pulse Resp SpO2  08/11/18 0644  121/69 97.6 F (36.4 C) - 68 16 97 %  08/11/18 0219 122/75 97.6 F (36.4 C) - (!) 59 16 98 %  08/10/18 2016 121/68 97.6 F (36.4 C) - 63 16 100 %  08/10/18 1623 138/71 97.7 F (36.5 C) Oral 76 16 98 %  08/10/18 1524 125/72 97.6 F (36.4 C) Oral 64 12 99 %  08/10/18 1431 133/74 97.6 F (36.4 C) - 73 17 100 %  08/10/18 1259 119/89 (!) 97.5 F (36.4 C) Oral 62 16 100 %  08/10/18 1245 124/74 (!) 96.8 F (36 C) - 61 14 100 %  08/10/18 1230 123/71 - - (!) 59 11 100 %  08/10/18 1215 116/77 - - 61 13 100 %  08/10/18 1200 124/73 - - 61 15 100 %  08/10/18 1145 118/81 - - 61 16 100 %  08/10/18 1130 121/66 (!) 97.5 F (36.4 C) - 67 12 100 %     Recent laboratory studies: No results for input(s): WBC, HGB, HCT, PLT, NA, K, CL, CO2, BUN, CREATININE, GLUCOSE, INR, CALCIUM in the last 72 hours.  Invalid input(s): PT, 2   Discharge Medications:   Allergies as of 08/11/2018      Reactions   Hibiclens [chlorhexidine]    Itching and rash      Medication List    STOP taking these medications   ibuprofen 200 MG tablet Commonly known as: ADVIL     TAKE these medications  Advil PM 200-25 MG Caps Generic drug: Ibuprofen-diphenhydrAMINE HCl Take 2 tablets by mouth at bedtime.   azelastine 0.1 % nasal spray Commonly known as: ASTELIN Place 1 spray into both nostrils daily as needed for allergies. Use in each nostril as directed   EpiPen 2-Pak 0.3 mg/0.3 mL Soaj injection Generic drug: EPINEPHrine Inject 0.3 mg into the muscle once as needed (allergic reaction).   HYDROcodone-acetaminophen 5-325 MG tablet Commonly known as: NORCO/VICODIN Take 1-2 tablets by mouth every 6 (six) hours as needed for moderate pain (pain score 4-6).   levothyroxine 50 MCG tablet Commonly known as: SYNTHROID Take 50 mcg by mouth at bedtime.   loratadine 10 MG tablet Commonly known as: CLARITIN Take 10 mg by mouth at bedtime.   Lubricant Eye Nighttime Oint Place 1 application into the right eye at  bedtime.   multivitamin with minerals Tabs tablet Take 1 tablet by mouth daily.   pantoprazole 40 MG tablet Commonly known as: PROTONIX Take 40 mg by mouth at bedtime.   saw palmetto 80 MG capsule Take 80 mg by mouth daily. 2 pills   tiZANidine 4 MG tablet Commonly known as: Zanaflex Take 1 tablet (4 mg total) by mouth every 6 (six) hours as needed.   vitamin C 1000 MG tablet Take 1,000 mg by mouth daily.            Durable Medical Equipment  (From admission, onward)         Start     Ordered   08/10/18 1328  DME Walker rolling  Once    Question:  Patient needs a walker to treat with the following condition  Answer:  Primary osteoarthritis of right hip   08/10/18 1327   08/10/18 1328  DME 3 n 1  Once     08/10/18 1327   08/10/18 1328  DME Bedside commode  Once    Question:  Patient needs a bedside commode to treat with the following condition  Answer:  Primary osteoarthritis of right hip   08/10/18 1327          Diagnostic Studies: Dg C-arm 1-60 Min-no Report  Result Date: 08/10/2018 Fluoroscopy was utilized by the requesting physician.  No radiographic interpretation.   Dg Hip Operative Unilat W Or W/o Pelvis Right  Result Date: 08/10/2018 CLINICAL DATA:  Operative imaging for total right hip arthroplasty. EXAM: OPERATIVE RIGHT HIP (WITH PELVIS IF PERFORMED) 4 VIEWS TECHNIQUE: Fluoroscopic spot image(s) were submitted for interpretation post-operatively. COMPARISON:  None. FINDINGS: Images show placement of the acetabular and femoral prosthetic components of the right hip arthroplasty. The components appear well seated and aligned. No acute fracture or evidence of an operative complication. IMPRESSION: Operative imaging for right hip total arthroplasty. Arthroplasty appears well positioned. Electronically Signed   By: Lajean Manes M.D.   On: 08/10/2018 13:56    Disposition: Discharge disposition: 01-Home or Self Care       Discharge Instructions    Call  MD / Call 911   Complete by: As directed    If you experience chest pain or shortness of breath, CALL 911 and be transported to the hospital emergency room.  If you develope a fever above 101 F, pus (white drainage) or increased drainage or redness at the wound, or calf pain, call your surgeon's office.   Constipation Prevention   Complete by: As directed    Drink plenty of fluids.  Prune juice may be helpful.  You may use a stool softener, such as  Colace (over the counter) 100 mg twice a day.  Use MiraLax (over the counter) for constipation as needed.   Diet - low sodium heart healthy   Complete by: As directed    Discharge instructions   Complete by: As directed    INSTRUCTIONS AFTER JOINT REPLACEMENT   Remove items at home which could result in a fall. This includes throw rugs or furniture in walking pathways ICE to the affected joint every three hours while awake for 30 minutes at a time, for at least the first 3-5 days, and then as needed for pain and swelling.  Continue to use ice for pain and swelling. You may notice swelling that will progress down to the foot and ankle.  This is normal after surgery.  Elevate your leg when you are not up walking on it.   Continue to use the breathing machine you got in the hospital (incentive spirometer) which will help keep your temperature down.  It is common for your temperature to cycle up and down following surgery, especially at night when you are not up moving around and exerting yourself.  The breathing machine keeps your lungs expanded and your temperature down.   DIET:  As you were doing prior to hospitalization, we recommend a well-balanced diet.  DRESSING / WOUND CARE / SHOWERING  You may shower 3 days after surgery, but keep the wounds dry during showering.  You may use an occlusive plastic wrap (Press'n Seal for example), NO SOAKING/SUBMERGING IN THE BATHTUB.  If the bandage gets wet, change with a clean dry gauze.  If the incision gets  wet, pat the wound dry with a clean towel.  ACTIVITY  Increase activity slowly as tolerated, but follow the weight bearing instructions below.   No driving for 6 weeks or until further direction given by your physician.  You cannot drive while taking narcotics.  No lifting or carrying greater than 10 lbs. until further directed by your surgeon. Avoid periods of inactivity such as sitting longer than an hour when not asleep. This helps prevent blood clots.  You may return to work once you are authorized by your doctor.     WEIGHT BEARING   Weight bearing as tolerated with assist device (walker, cane, etc) as directed, use it as long as suggested by your surgeon or therapist, typically at least 4-6 weeks.   EXERCISES  Results after joint replacement surgery are often greatly improved when you follow the exercise, range of motion and muscle strengthening exercises prescribed by your doctor. Safety measures are also important to protect the joint from further injury. Any time any of these exercises cause you to have increased pain or swelling, decrease what you are doing until you are comfortable again and then slowly increase them. If you have problems or questions, call your caregiver or physical therapist for advice.   Rehabilitation is important following a joint replacement. After just a few days of immobilization, the muscles of the leg can become weakened and shrink (atrophy).  These exercises are designed to build up the tone and strength of the thigh and leg muscles and to improve motion. Often times heat used for twenty to thirty minutes before working out will loosen up your tissues and help with improving the range of motion but do not use heat for the first two weeks following surgery (sometimes heat can increase post-operative swelling).   These exercises can be done on a training (exercise) mat, on the floor, on a table  or on a bed. Use whatever works the best and is most  comfortable for you.    Use music or television while you are exercising so that the exercises are a pleasant break in your day. This will make your life better with the exercises acting as a break in your routine that you can look forward to.   Perform all exercises about fifteen times, three times per day or as directed.  You should exercise both the operative leg and the other leg as well.   Exercises include:   Quad Sets - Tighten up the muscle on the front of the thigh (Quad) and hold for 5-10 seconds.   Straight Leg Raises - With your knee straight (if you were given a brace, keep it on), lift the leg to 60 degrees, hold for 3 seconds, and slowly lower the leg.  Perform this exercise against resistance later as your leg gets stronger.  Leg Slides: Lying on your back, slowly slide your foot toward your buttocks, bending your knee up off the floor (only go as far as is comfortable). Then slowly slide your foot back down until your leg is flat on the floor again.  Angel Wings: Lying on your back spread your legs to the side as far apart as you can without causing discomfort.  Hamstring Strength:  Lying on your back, push your heel against the floor with your leg straight by tightening up the muscles of your buttocks.  Repeat, but this time bend your knee to a comfortable angle, and push your heel against the floor.  You may put a pillow under the heel to make it more comfortable if necessary.   A rehabilitation program following joint replacement surgery can speed recovery and prevent re-injury in the future due to weakened muscles. Contact your doctor or a physical therapist for more information on knee rehabilitation.    CONSTIPATION  Constipation is defined medically as fewer than three stools per week and severe constipation as less than one stool per week.  Even if you have a regular bowel pattern at home, your normal regimen is likely to be disrupted due to multiple reasons following surgery.   Combination of anesthesia, postoperative narcotics, change in appetite and fluid intake all can affect your bowels.   YOU MUST use at least one of the following options; they are listed in order of increasing strength to get the job done.  They are all available over the counter, and you may need to use some, POSSIBLY even all of these options:    Drink plenty of fluids (prune juice may be helpful) and high fiber foods Colace 100 mg by mouth twice a day  Senokot for constipation as directed and as needed Dulcolax (bisacodyl), take with full glass of water  Miralax (polyethylene glycol) once or twice a day as needed.  If you have tried all these things and are unable to have a bowel movement in the first 3-4 days after surgery call either your surgeon or your primary doctor.    If you experience loose stools or diarrhea, hold the medications until you stool forms back up.  If your symptoms do not get better within 1 week or if they get worse, check with your doctor.  If you experience "the worst abdominal pain ever" or develop nausea or vomiting, please contact the office immediately for further recommendations for treatment.   ITCHING:  If you experience itching with your medications, try taking only a single pain  pill, or even half a pain pill at a time.  You can also use Benadryl over the counter for itching or also to help with sleep.   TED HOSE STOCKINGS:  Use stockings on both legs until for at least 2 weeks or as directed by physician office. They may be removed at night for sleeping.  MEDICATIONS:  See your medication summary on the "After Visit Summary" that nursing will review with you.  You may have some home medications which will be placed on hold until you complete the course of blood thinner medication.  It is important for you to complete the blood thinner medication as prescribed.  PRECAUTIONS:  If you experience chest pain or shortness of breath - call 911 immediately for  transfer to the hospital emergency department.   If you develop a fever greater that 101 F, purulent drainage from wound, increased redness or drainage from wound, foul odor from the wound/dressing, or calf pain - CONTACT YOUR SURGEON.                                                   FOLLOW-UP APPOINTMENTS:  If you do not already have a post-op appointment, please call the office for an appointment to be seen by your surgeon.  Guidelines for how soon to be seen are listed in your "After Visit Summary", but are typically between 1-4 weeks after surgery.  OTHER INSTRUCTIONS:   Knee Replacement:  Do not place pillow under knee, focus on keeping the knee straight while resting. CPM instructions: 0-90 degrees, 2 hours in the morning, 2 hours in the afternoon, and 2 hours in the evening. Place foam block, curve side up under heel at all times except when in CPM or when walking.  DO NOT modify, tear, cut, or change the foam block in any way.  MAKE SURE YOU:  Understand these instructions.  Get help right away if you are not doing well or get worse.    Thank you for letting us be a part of your medical care team.  It is a privilege we respect greatly.  We hope these instructions will help you stay on track for a fast and full recovery!   Increase activity slowly as tolerated   Complete by: As directed       Follow-up Information    Melrose Nakayama, MD. Schedule an appointment as soon as possible for a visit in 2 weeks.   Specialty: Orthopedic Surgery Contact information: [place] [address] [phone]            Signed: [name] Blas Riches 08/11/2018, 8:03 AM

## 2018-08-11 NOTE — Progress Notes (Signed)
CSW received consult to arrange Home Health PT services for the patient. Patient declines home health PT services. He plans to complete HEP. Patient reports he has his first Outpatient therapy appointment at [facility] on Friday 7/31. Patient reports he has RW and declines a 3 IN 1. CSW notified the patient nurse.  Kathrin Greathouse, Marlinda Mike, MSW Transitions of Care-Clinical Social Worker  [phone] 08/11/2018  10:39 AM

## 2019-01-27 IMAGING — RF DG C-ARM 61-120 MIN
1 series · 2 of 2 positions shown · non-contrast
Comparison: None.

CLINICAL DATA: Left anterior hip

EXAM:
OPERATIVE LEFT HIP (WITH PELVIS IF PERFORMED) 2 VIEWS
TECHNIQUE: Fluoroscopic spot image(s) were submitted for interpretation
post-operatively.
FLUOROSCOPY TIME:  42 seconds

[Series 1: run · 2 of 2 slices shown]
[im 1/2]
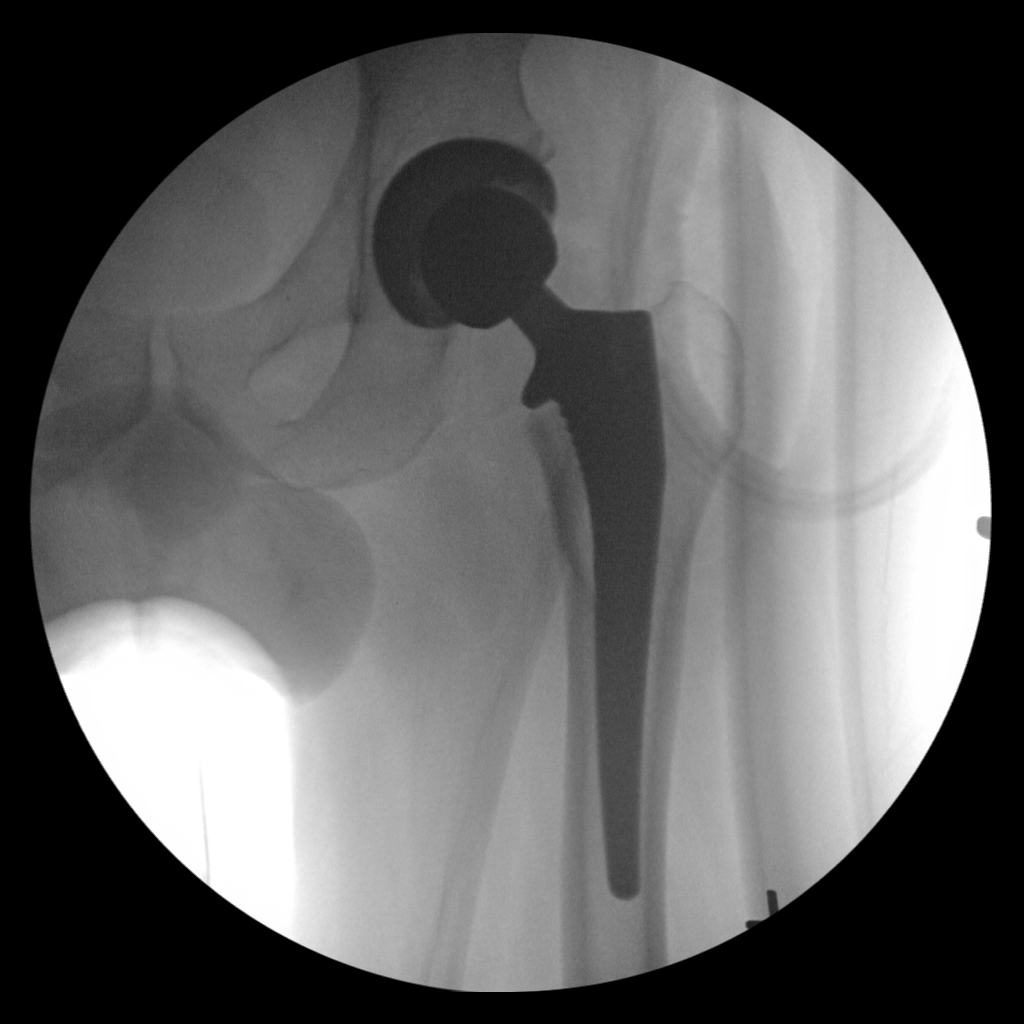
[im 2/2]
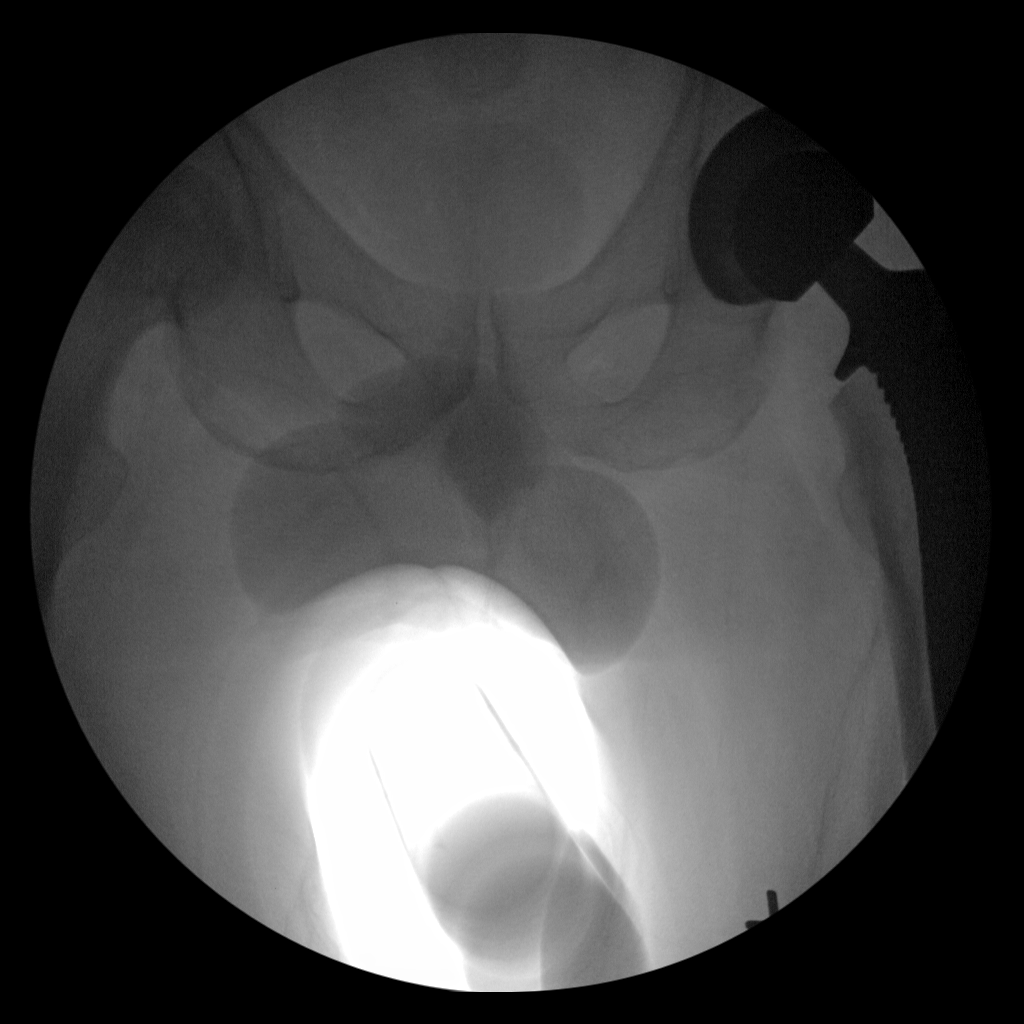

[2 of 2 positions shown; findings below may reference images not displayed]

FINDINGS: Intraoperative fluoroscopic images during left total hip
arthroplasty, in satisfactory position.
IMPRESSION: Intraoperative fluoroscopic images during left total hip
arthroplasty.

## 2019-08-30 ENCOUNTER — Telehealth: Payer: Self-pay

## 2019-08-30 ENCOUNTER — Other Ambulatory Visit: Payer: Self-pay

## 2019-08-30 ENCOUNTER — Ambulatory Visit: Payer: BC Managed Care – PPO | Admitting: Gastroenterology

## 2019-08-30 ENCOUNTER — Encounter: Payer: Self-pay | Admitting: Gastroenterology

## 2019-08-30 DIAGNOSIS — R195 Other fecal abnormalities: Secondary | ICD-10-CM | POA: Insufficient documentation

## 2019-08-30 DIAGNOSIS — D509 Iron deficiency anemia, unspecified: Secondary | ICD-10-CM | POA: Diagnosis not present

## 2019-08-30 NOTE — Telephone Encounter (Signed)
Gave pt procedure instructions this morning at [facility] for TCS/EGD scheduled 09/09/19. Wrote on instructions after printed. He may have clear liquids until 10:00am morning of procedure. NPO after 10:00am.

## 2019-08-30 NOTE — H&P (View-Only) (Signed)
Primary Care Physician:  Josem Kaufmann, MD  Referring Physician: Dr. Laurena Slimmer (Hematology) Primary Gastroenterologist:  Dr. Abbey Chatters  Chief Complaint  Patient presents with  . Anemia    unable to recall last tcs, tired  . dark stool    +for blood in stool    HPI:   Travis Evans is a 59 y.o. male presenting today at the request of Dr. Junius Roads with [facility] due to IDA and heme positive stools. He reports a colonoscopy in remote past by Dr. Britta Mccreedy in [place], but records are not available at time of visit. He is unsure if he had any polyps. Believes he had an EGD over 10 years ago and was told he had PUD.   Outside labs Aug 18, 2019: Hgb 12.4, Hct 37.4, platelets 261, ferritin 9.    Taking iron by mouth. Stool is dark at times on iron intermittently. Heme positive. Sometimes bright red blood with wiping. No straining. Started feeling sluggish. Had yearly check up with PCP and found out anemia, so was referred to Hematology. Feels tired. Sometimes vague lower abdominal discomfort no precipitating or relieving factors. Diarrhea occasionally but not daily. No weight loss. No solid food dysphagia. Chronic GERD. On Protonix since 2006. Vague nausea at times and dizzy headed. Started a month ago. Ibuprofen as needed for joint pain/headaches. Feels bloated, lots of gas for past several months. BM daily. Feels productive. He is concerned about an area at his rectum. No pain or discomfort.   Past Medical History:  Diagnosis Date  . 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 Complication of anesthesia    difficulty waking x 1  . DJD (degenerative joint disease)    left hip  . Full dentures   . Gastroesophageal reflux   . Lower back injury   . Peptic ulcer    in 2006, thought to be due to medications per patient?   . Sleep apnea    wears CPAP set on 8  . Thyroid disease      Past Surgical History:  Procedure Laterality Date  . CARPAL TUNNEL RELEASE    . COLONOSCOPY WITH ESOPHAGOGASTRODUODENOSCOPY (EGD)    . CYST REMOVAL NECK    . EYE SURGERY     cornea surgery  . JOINT REPLACEMENT    . LEFT HEART CATH AND CORONARY ANGIOGRAPHY N/A 04/07/2016   Procedure: Left Heart Cath and Coronary Angiography;  Surgeon: Troy Sine, MD;  Location: [facility];  Service: Cardiovascular;  Laterality: N/A;  . MULTIPLE TOOTH EXTRACTIONS    . TONSILLECTOMY    . TOTAL HIP ARTHROPLASTY Left 02/03/2017   Procedure: TOTAL HIP ARTHROPLASTY ANTERIOR APPROACH;  Surgeon: Melrose Nakayama, MD;  Location: [facility];  Service: Orthopedics;  Laterality: Left;  . TOTAL HIP ARTHROPLASTY Right 08/10/2018   Procedure: TOTAL HIP ARTHROPLASTY ANTERIOR APPROACH;  Surgeon: Melrose Nakayama, MD;  Location: [facility];  Service: Orthopedics;  Laterality: Right;  . UMBILICAL HERNIA REPAIR      Current Outpatient Medications  Medication Sig Dispense Refill  . azelastine (ASTELIN) 0.1 % nasal spray Place 1 spray into both nostrils daily as needed for allergies. Use in each nostril as directed     . B Complex-C (SUPER B COMPLEX PO) Take by mouth daily.    . Cholecalciferol (VITAMIN D3) 125 MCG (5000 UT) TABS Take by mouth  daily.    Marland Kitchen EPINEPHrine (EPIPEN 2-PAK) 0.3 mg/0.3 mL IJ SOAJ injection Inject 0.3 mg into the muscle once as needed (allergic reaction).    . FERROCITE 324 MG TABS tablet Take 1 tablet by mouth daily.    Marland Kitchen loratadine (CLARITIN) 10 MG tablet Take 10 mg by mouth at bedtime.     . Multiple Vitamin (MULTIVITAMIN WITH MINERALS) TABS tablet Take 1 tablet by mouth daily.    . Multiple Vitamins-Minerals (ZINC PO) Take by mouth daily.    . pantoprazole (PROTONIX) 40 MG tablet Take 40 mg by mouth at bedtime.     . tadalafil (CIALIS) 5 MG tablet Take 5 mg by mouth daily.    . traZODone (DESYREL) 50 MG tablet Take 50 mg by mouth at bedtime.    . White Petrolatum-Mineral Oil (LUBRICANT EYE  NIGHTTIME) OINT Place 1 application into the right eye at bedtime.     No current facility-administered medications for this visit.    Allergies as of 08/30/2019 - Review Complete 08/30/2019  Allergen Reaction Noted  . Hibiclens [chlorhexidine]  08/06/2018    Family History  Problem Relation Age of Onset  . Pancreatic cancer Mother   . Stroke Father   . Pancreatic cancer Nephew 37  . Colon cancer Neg Hx     Social History   Socioeconomic History  . Marital status: Married    Spouse name: Not on file  . Number of children: Not on file  . Years of education: Not on file  . Highest education level: Not on file  Occupational History  . Not on file  Tobacco Use  . Smoking status: Former Smoker    Packs/day: 3.00    Types: Cigarettes    Quit date: 04/03/2012    Years since quitting: 7.4  . Smokeless tobacco: Former Systems developer    Types: Secondary school teacher  . Vaping Use: Never used  Substance and Sexual Activity  . Alcohol use: Yes    Comment: occasional beer  . Drug use: No  . Sexual activity: Not on file  Other Topics Concern  . Not on file  Social History Narrative  . Not on file   Social Determinants of Health   Financial Resource Strain:   . Difficulty of Paying Living Expenses:   Food Insecurity:   . Worried About Charity fundraiser in the Last Year:   . Arboriculturist in the Last Year:   Transportation Needs:   . Film/video editor (Medical):   Marland Kitchen Lack of Transportation (Non-Medical):   Physical Activity:   . Days of Exercise per Week:   . Minutes of Exercise per Session:   Stress:   . Feeling of Stress :   Social Connections:   . Frequency of Communication with Friends and Family:   . Frequency of Social Gatherings with Friends and Family:   . Attends Religious Services:   . Active Member of Clubs or Organizations:   . Attends Archivist Meetings:   Marland Kitchen Marital Status:   Intimate Partner Violence:   . Fear of Current or Ex-Partner:   .  Emotionally Abused:   Marland Kitchen Physically Abused:   . Sexually Abused:     Review of Systems: Gen: Denies any fever, chills, fatigue, weight loss, lack of appetite.  CV: Denies chest pain, heart palpitations, peripheral edema, syncope.  Resp: Denies shortness of breath at rest or with exertion. Denies wheezing or cough.  GI: see HPI GU :  Denies urinary burning, urinary frequency, urinary hesitancy MS: Denies joint pain, muscle weakness, cramps, or limitation of movement.  Derm: Denies rash, itching, dry skin Psych: Denies depression, anxiety, memory loss, and confusion Heme: see HPI  Physical Exam: BP 120/72   Pulse 64   Temp (!) 97.1 F (36.2 C) (Temporal)   Ht 5\' 10"  (1.778 m)   Wt 219 lb (99.3 kg)   BMI 31.42 kg/m  General:   Alert and oriented. Pleasant and cooperative. Well-nourished and well-developed.  Head:  Normocephalic and atraumatic. Eyes:  Without icterus, sclera clear and conjunctiva pink.  Ears:  Normal auditory acuity. Mouth:  Mask in place Lungs:  Clear to auscultation bilaterally. No wheezes, rales, or rhonchi. No distress.  Heart:  S1, S2 present without murmurs appreciated.  Abdomen:  +BS, soft, non-tender and non-distended. No HSM noted. No guarding or rebound. No masses appreciated.  Rectal:  No obvious fissure, appears to have skin tag in posterior midline  Msk:  Symmetrical without gross deformities. Normal posture. Extremities:  Without edema. Neurologic:  Alert and  oriented x4;  grossly normal neurologically. Skin:  Intact without significant lesions or rashes. Psych:  Alert and cooperative. Normal mood and affect.  ASSESSMENT: Travis Evans is a 59 y.o. male presenting today at the request of [facility], Dr. Junius Roads, due to new onset IDA and heme positive stool. Outside labs with Hgb in 12 range, ferritin 9. He has been started on oral iron.  Last colonoscopy in remote past in [place], unclear if any polyps or not; he also reports  remote history of PUD greater than 10 years ago that sounds NSAID-related. Scant hematochezia with wiping, and stool is dark on iron. Otherwise, no alarming signs/symptoms. Discussed need for diagnostic TCS/EGD in near future. May ultimately need capsule study.    PLAN:  Proceed with TCS/EGD with Dr. Abbey Chatters in near future: the risks, benefits, and alternatives have been discussed with the patient in detail. The patient states understanding and desires to proceed.  Hold iron 7 days prior  May need capsule study if TCS/EGD negative.   Further recommendations to follow  Annitta Needs, PhD, ANP-BC [facility]

## 2019-08-30 NOTE — Patient Instructions (Signed)
We are arranging a colonoscopy and upper endoscopy with Dr. Abbey Chatters in the near future.   Please hold iron for 7 days prior to the procedure.  Further recommendations to follow!   It was a pleasure to see you today. I want to create trusting relationships with patients to provide genuine, compassionate, and quality care. I value your feedback. If you receive a survey regarding your visit,  I greatly appreciate you taking time to fill this out.   Annitta Needs, PhD, ANP-BC [facility]enter[facility]

## 2019-08-30 NOTE — Patient Instructions (Signed)
No PA needed for TCS/EGD per AIM website.

## 2019-08-30 NOTE — Progress Notes (Signed)
Primary Care Physician:  Josem Kaufmann, MD  Referring Physician: Dr. Laurena Slimmer (Hematology) Primary Gastroenterologist:  Dr. Abbey Chatters  Chief Complaint  Patient presents with  . Anemia    unable to recall last tcs, tired  . dark stool    +for blood in stool    HPI:   Travis Evans is a 59 y.o. male presenting today at the request of Dr. Junius Roads with [facility] due to IDA and heme positive stools. He reports a colonoscopy in remote past by Dr. Britta Mccreedy in [place], but records are not available at time of visit. He is unsure if he had any polyps. Believes he had an EGD over 10 years ago and was told he had PUD.   Outside labs Aug 18, 2019: Hgb 12.4, Hct 37.4, platelets 261, ferritin 9.    Taking iron by mouth. Stool is dark at times on iron intermittently. Heme positive. Sometimes bright red blood with wiping. No straining. Started feeling sluggish. Had yearly check up with PCP and found out anemia, so was referred to Hematology. Feels tired. Sometimes vague lower abdominal discomfort no precipitating or relieving factors. Diarrhea occasionally but not daily. No weight loss. No solid food dysphagia. Chronic GERD. On Protonix since 2006. Vague nausea at times and dizzy headed. Started a month ago. Ibuprofen as needed for joint pain/headaches. Feels bloated, lots of gas for past several months. BM daily. Feels productive. He is concerned about an area at his rectum. No pain or discomfort.   Past Medical History:  Diagnosis Date  . 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 Complication of anesthesia    difficulty waking x 1  . DJD (degenerative joint disease)    left hip  . Full dentures   . Gastroesophageal reflux   . Lower back injury   . Peptic ulcer    in 2006, thought to be due to medications per patient?   . Sleep apnea    wears CPAP set on 8  . Thyroid disease      Past Surgical History:  Procedure Laterality Date  . CARPAL TUNNEL RELEASE    . COLONOSCOPY WITH ESOPHAGOGASTRODUODENOSCOPY (EGD)    . CYST REMOVAL NECK    . EYE SURGERY     cornea surgery  . JOINT REPLACEMENT    . LEFT HEART CATH AND CORONARY ANGIOGRAPHY N/A 04/07/2016   Procedure: Left Heart Cath and Coronary Angiography;  Surgeon: Troy Sine, MD;  Location: [facility];  Service: Cardiovascular;  Laterality: N/A;  . MULTIPLE TOOTH EXTRACTIONS    . TONSILLECTOMY    . TOTAL HIP ARTHROPLASTY Left 02/03/2017   Procedure: TOTAL HIP ARTHROPLASTY ANTERIOR APPROACH;  Surgeon: Melrose Nakayama, MD;  Location: [facility];  Service: Orthopedics;  Laterality: Left;  . TOTAL HIP ARTHROPLASTY Right 08/10/2018   Procedure: TOTAL HIP ARTHROPLASTY ANTERIOR APPROACH;  Surgeon: Melrose Nakayama, MD;  Location: [facility];  Service: Orthopedics;  Laterality: Right;  . UMBILICAL HERNIA REPAIR      Current Outpatient Medications  Medication Sig Dispense Refill  . azelastine (ASTELIN) 0.1 % nasal spray Place 1 spray into both nostrils daily as needed for allergies. Use in each nostril as directed     . B Complex-C (SUPER B COMPLEX PO) Take by mouth daily.    . Cholecalciferol (VITAMIN D3) 125 MCG (5000 UT) TABS Take by mouth  daily.    Marland Kitchen EPINEPHrine (EPIPEN 2-PAK) 0.3 mg/0.3 mL IJ SOAJ injection Inject 0.3 mg into the muscle once as needed (allergic reaction).    . FERROCITE 324 MG TABS tablet Take 1 tablet by mouth daily.    Marland Kitchen loratadine (CLARITIN) 10 MG tablet Take 10 mg by mouth at bedtime.     . Multiple Vitamin (MULTIVITAMIN WITH MINERALS) TABS tablet Take 1 tablet by mouth daily.    . Multiple Vitamins-Minerals (ZINC PO) Take by mouth daily.    . pantoprazole (PROTONIX) 40 MG tablet Take 40 mg by mouth at bedtime.     . tadalafil (CIALIS) 5 MG tablet Take 5 mg by mouth daily.    . traZODone (DESYREL) 50 MG tablet Take 50 mg by mouth at bedtime.    . White Petrolatum-Mineral Oil (LUBRICANT EYE  NIGHTTIME) OINT Place 1 application into the right eye at bedtime.     No current facility-administered medications for this visit.    Allergies as of 08/30/2019 - Review Complete 08/30/2019  Allergen Reaction Noted  . Hibiclens [chlorhexidine]  08/06/2018    Family History  Problem Relation Age of Onset  . Pancreatic cancer Mother   . Stroke Father   . Pancreatic cancer Nephew 37  . Colon cancer Neg Hx     Social History   Socioeconomic History  . Marital status: Married    Spouse name: Not on file  . Number of children: Not on file  . Years of education: Not on file  . Highest education level: Not on file  Occupational History  . Not on file  Tobacco Use  . Smoking status: Former Smoker    Packs/day: 3.00    Types: Cigarettes    Quit date: 04/03/2012    Years since quitting: 7.4  . Smokeless tobacco: Former Systems developer    Types: Secondary school teacher  . Vaping Use: Never used  Substance and Sexual Activity  . Alcohol use: Yes    Comment: occasional beer  . Drug use: No  . Sexual activity: Not on file  Other Topics Concern  . Not on file  Social History Narrative  . Not on file   Social Determinants of Health   Financial Resource Strain:   . Difficulty of Paying Living Expenses:   Food Insecurity:   . Worried About Charity fundraiser in the Last Year:   . Arboriculturist in the Last Year:   Transportation Needs:   . Film/video editor (Medical):   Marland Kitchen Lack of Transportation (Non-Medical):   Physical Activity:   . Days of Exercise per Week:   . Minutes of Exercise per Session:   Stress:   . Feeling of Stress :   Social Connections:   . Frequency of Communication with Friends and Family:   . Frequency of Social Gatherings with Friends and Family:   . Attends Religious Services:   . Active Member of Clubs or Organizations:   . Attends Archivist Meetings:   Marland Kitchen Marital Status:   Intimate Partner Violence:   . Fear of Current or Ex-Partner:   .  Emotionally Abused:   Marland Kitchen Physically Abused:   . Sexually Abused:     Review of Systems: Gen: Denies any fever, chills, fatigue, weight loss, lack of appetite.  CV: Denies chest pain, heart palpitations, peripheral edema, syncope.  Resp: Denies shortness of breath at rest or with exertion. Denies wheezing or cough.  GI: see HPI GU :  Denies urinary burning, urinary frequency, urinary hesitancy MS: Denies joint pain, muscle weakness, cramps, or limitation of movement.  Derm: Denies rash, itching, dry skin Psych: Denies depression, anxiety, memory loss, and confusion Heme: see HPI  Physical Exam: BP 120/72   Pulse 64   Temp (!) 97.1 F (36.2 C) (Temporal)   Ht 5\' 10"  (1.778 m)   Wt 219 lb (99.3 kg)   BMI 31.42 kg/m  General:   Alert and oriented. Pleasant and cooperative. Well-nourished and well-developed.  Head:  Normocephalic and atraumatic. Eyes:  Without icterus, sclera clear and conjunctiva pink.  Ears:  Normal auditory acuity. Mouth:  Mask in place Lungs:  Clear to auscultation bilaterally. No wheezes, rales, or rhonchi. No distress.  Heart:  S1, S2 present without murmurs appreciated.  Abdomen:  +BS, soft, non-tender and non-distended. No HSM noted. No guarding or rebound. No masses appreciated.  Rectal:  No obvious fissure, appears to have skin tag in posterior midline  Msk:  Symmetrical without gross deformities. Normal posture. Extremities:  Without edema. Neurologic:  Alert and  oriented x4;  grossly normal neurologically. Skin:  Intact without significant lesions or rashes. Psych:  Alert and cooperative. Normal mood and affect.  ASSESSMENT: Travis Evans is a 59 y.o. male presenting today at the request of [facility]-Hematology, Dr. Junius Roads, due to new onset IDA and heme positive stool. Outside labs with Hgb in 12 range, ferritin 9. He has been started on oral iron.  Last colonoscopy in remote past in [place], unclear if any polyps or not; he also reports  remote history of PUD greater than 10 years ago that sounds NSAID-related. Scant hematochezia with wiping, and stool is dark on iron. Otherwise, no alarming signs/symptoms. Discussed need for diagnostic TCS/EGD in near future. May ultimately need capsule study.    PLAN:  Proceed with TCS/EGD with Dr. Abbey Chatters in near future: the risks, benefits, and alternatives have been discussed with the patient in detail. The patient states understanding and desires to proceed.  Hold iron 7 days prior  May need capsule study if TCS/EGD negative.   Further recommendations to follow  Annitta Needs, PhD, ANP-BC [facility] Gastroenterology

## 2019-09-01 ENCOUNTER — Encounter (HOSPITAL_COMMUNITY)
Admission: RE | Admit: 2019-09-01 | Discharge: 2019-09-01 | Disposition: A | Discharge status: Home / Self Care | Payer: BC Managed Care – PPO | Source: Ambulatory Visit | Attending: Internal Medicine | Admitting: Internal Medicine

## 2019-09-01 ENCOUNTER — Other Ambulatory Visit: Payer: Self-pay

## 2019-09-08 ENCOUNTER — Other Ambulatory Visit: Payer: Self-pay

## 2019-09-08 ENCOUNTER — Other Ambulatory Visit (HOSPITAL_COMMUNITY)
Admission: RE | Admit: 2019-09-08 | Discharge: 2019-09-08 | Disposition: A | Discharge status: Home / Self Care | Payer: BC Managed Care – PPO | Source: Ambulatory Visit | Attending: Internal Medicine | Admitting: Internal Medicine

## 2019-09-08 DIAGNOSIS — Z01812 Encounter for preprocedural laboratory examination: Secondary | ICD-10-CM | POA: Insufficient documentation

## 2019-09-08 DIAGNOSIS — Z20822 Contact with and (suspected) exposure to covid-19: Secondary | ICD-10-CM | POA: Insufficient documentation

## 2019-09-08 LAB — SARS CORONAVIRUS 2 (TAT 6-24 HRS): SARS Coronavirus 2: NEGATIVE

## 2019-09-08 LAB — CBC WITH DIFFERENTIAL/PLATELET
Abs Immature Granulocytes: 0.01 10*3/uL (ref 0.00–0.07)
Basophils Absolute: 0 10*3/uL (ref 0.0–0.1)
Basophils Relative: 0 %
Eosinophils Absolute: 0.3 10*3/uL (ref 0.0–0.5)
Eosinophils Relative: 4 %
HCT: 36.2 % — ABNORMAL LOW (ref 39.0–52.0)
Hemoglobin: 11.8 g/dL — ABNORMAL LOW (ref 13.0–17.0)
Immature Granulocytes: 0 %
Lymphocytes Relative: 29 %
Lymphs Abs: 1.9 10*3/uL (ref 0.7–4.0)
MCH: 28.3 pg (ref 26.0–34.0)
MCHC: 32.6 g/dL (ref 30.0–36.0)
MCV: 86.8 fL (ref 80.0–100.0)
Monocytes Absolute: 0.4 10*3/uL (ref 0.1–1.0)
Monocytes Relative: 7 %
Neutro Abs: 3.7 10*3/uL (ref 1.7–7.7)
Neutrophils Relative %: 60 %
Platelets: 236 10*3/uL (ref 150–400)
RBC: 4.17 MIL/uL — ABNORMAL LOW (ref 4.22–5.81)
RDW: 14.5 % (ref 11.5–15.5)
WBC: 6.3 10*3/uL (ref 4.0–10.5)
nRBC: 0 % (ref 0.0–0.2)

## 2019-09-09 ENCOUNTER — Ambulatory Visit (HOSPITAL_COMMUNITY): Payer: BC Managed Care – PPO | Admitting: Anesthesiology

## 2019-09-09 ENCOUNTER — Encounter (HOSPITAL_COMMUNITY): Payer: Self-pay

## 2019-09-09 ENCOUNTER — Encounter (HOSPITAL_COMMUNITY)
Admission: RE | Disposition: A | Discharge status: Home / Self Care | Payer: Self-pay | Source: Home / Self Care | Attending: Internal Medicine

## 2019-09-09 ENCOUNTER — Telehealth: Payer: Self-pay | Admitting: *Deleted

## 2019-09-09 ENCOUNTER — Ambulatory Visit (HOSPITAL_COMMUNITY)
Admission: RE | Admit: 2019-09-09 | Discharge: 2019-09-09 | Disposition: A | Discharge status: Home / Self Care | Payer: BC Managed Care – PPO | Attending: Internal Medicine | Admitting: Internal Medicine

## 2019-09-09 ENCOUNTER — Other Ambulatory Visit: Payer: Self-pay

## 2019-09-09 DIAGNOSIS — K2970 Gastritis, unspecified, without bleeding: Secondary | ICD-10-CM | POA: Diagnosis not present

## 2019-09-09 DIAGNOSIS — G4730 Sleep apnea, unspecified: Secondary | ICD-10-CM | POA: Insufficient documentation

## 2019-09-09 DIAGNOSIS — K5730 Diverticulosis of large intestine without perforation or abscess without bleeding: Secondary | ICD-10-CM | POA: Diagnosis not present

## 2019-09-09 DIAGNOSIS — D123 Benign neoplasm of transverse colon: Secondary | ICD-10-CM | POA: Insufficient documentation

## 2019-09-09 DIAGNOSIS — Z96643 Presence of artificial hip joint, bilateral: Secondary | ICD-10-CM | POA: Insufficient documentation

## 2019-09-09 DIAGNOSIS — K621 Rectal polyp: Secondary | ICD-10-CM | POA: Insufficient documentation

## 2019-09-09 DIAGNOSIS — Z87891 Personal history of nicotine dependence: Secondary | ICD-10-CM | POA: Diagnosis not present

## 2019-09-09 DIAGNOSIS — Z79899 Other long term (current) drug therapy: Secondary | ICD-10-CM | POA: Diagnosis not present

## 2019-09-09 DIAGNOSIS — K635 Polyp of colon: Secondary | ICD-10-CM

## 2019-09-09 DIAGNOSIS — K648 Other hemorrhoids: Secondary | ICD-10-CM | POA: Insufficient documentation

## 2019-09-09 DIAGNOSIS — R195 Other fecal abnormalities: Secondary | ICD-10-CM | POA: Diagnosis not present

## 2019-09-09 DIAGNOSIS — Z8711 Personal history of peptic ulcer disease: Secondary | ICD-10-CM | POA: Diagnosis not present

## 2019-09-09 DIAGNOSIS — K219 Gastro-esophageal reflux disease without esophagitis: Secondary | ICD-10-CM | POA: Insufficient documentation

## 2019-09-09 DIAGNOSIS — D509 Iron deficiency anemia, unspecified: Secondary | ICD-10-CM | POA: Diagnosis not present

## 2019-09-09 HISTORY — PX: ESOPHAGOGASTRODUODENOSCOPY (EGD) WITH PROPOFOL: SHX5813

## 2019-09-09 HISTORY — PX: COLONOSCOPY WITH PROPOFOL: SHX5780

## 2019-09-09 HISTORY — PX: BIOPSY: SHX5522

## 2019-09-09 SURGERY — COLONOSCOPY WITH PROPOFOL
Anesthesia: General

## 2019-09-09 MED ORDER — GLYCOPYRROLATE 0.2 MG/ML IJ SOLN
0.2000 mg | Freq: Once | INTRAMUSCULAR | Status: AC
  Administered 2019-09-09: 0.2 mg via INTRAVENOUS

## 2019-09-09 MED ORDER — STERILE WATER FOR IRRIGATION IR SOLN
Status: DC | PRN
  Administered 2019-09-09: 1.5 mL

## 2019-09-09 MED ORDER — PROPOFOL 10 MG/ML IV BOLUS
INTRAVENOUS | Status: AC
  Filled 2019-09-09: qty 60

## 2019-09-09 MED ORDER — LIDOCAINE VISCOUS HCL 2 % MT SOLN
OROMUCOSAL | Status: AC
  Filled 2019-09-09: qty 15

## 2019-09-09 MED ORDER — LIDOCAINE VISCOUS HCL 2 % MT SOLN
15.0000 mL | Freq: Once | OROMUCOSAL | Status: AC
  Administered 2019-09-09: 15 mL via OROMUCOSAL

## 2019-09-09 MED ORDER — LACTATED RINGERS IV SOLN
INTRAVENOUS | Status: DC
  Administered 2019-09-09: 1000 mL via INTRAVENOUS

## 2019-09-09 MED ORDER — PROPOFOL 500 MG/50ML IV EMUL
INTRAVENOUS | Status: DC | PRN
  Administered 2019-09-09: 150 ug/kg/min via INTRAVENOUS

## 2019-09-09 MED ORDER — GLYCOPYRROLATE 0.2 MG/ML IJ SOLN
INTRAMUSCULAR | Status: AC
  Filled 2019-09-09: qty 1

## 2019-09-09 MED ORDER — PROPOFOL 10 MG/ML IV BOLUS
INTRAVENOUS | Status: DC | PRN
  Administered 2019-09-09: 20 mg via INTRAVENOUS
  Administered 2019-09-09 (×2): 50 mg via INTRAVENOUS

## 2019-09-09 NOTE — Interval H&P Note (Signed)
History and Physical Interval Note:  09/09/2019 8:11 AM  Travis Evans  has presented today for surgery, with the diagnosis of iron deficiency anemia.  The various methods of treatment have been discussed with the patient and family. After consideration of risks, benefits and other options for treatment, the patient has consented to  Procedure(s) with comments: COLONOSCOPY WITH PROPOFOL (N/A) - 2:30pm ESOPHAGOGASTRODUODENOSCOPY (EGD) WITH PROPOFOL (N/A) as a surgical intervention.  The patient's history has been reviewed, patient examined, no change in status, stable for surgery.  I have reviewed the patient's chart and labs.  Questions were answered to the patient's satisfaction.     [name]

## 2019-09-09 NOTE — Op Note (Signed)
[facility] Patient Name: Travis Evans Procedure Date: 09/09/2019 8:40 AM MRN: [id number] Date of Birth: 04-20-1960 Attending MD: Elon Alas. Abbey Chatters DO CSN: [id number] Age: 59 Admit Type: Outpatient Procedure:                Upper GI endoscopy Indications:              Iron deficiency anemia Providers:                Elon Alas. Abbey Chatters, DO, Janeece Riggers, RN, Lambert Mody, Casimer Bilis, Technician,                            Randa Spike, Technician Referring MD:              Medicines:                See the Anesthesia note for documentation of the                            administered medications Complications:            No immediate complications. Estimated Blood Loss:     Estimated blood loss was minimal. Procedure:                Pre-Anesthesia Assessment:                           - The anesthesia plan was to use monitored                            anesthesia care (MAC).                           After obtaining informed consent, the endoscope was                            passed under direct vision. Throughout the                            procedure, the patient's blood pressure, pulse, and                            oxygen saturations were monitored continuously. The                            [id number]-H190 ([id number]) scope was introduced through the                            mouth, and advanced to the second part of duodenum.                            The upper GI endoscopy was accomplished without                            difficulty. The patient tolerated the  procedure                            well. Scope In: 8:49:47 AM Scope Out: 8:51:51 AM Total Procedure Duration: 0 hours 2 minutes 4 seconds  Findings:      There is no endoscopic evidence of Barrett's esophagus, bleeding, areas       of erosion, esophagitis, stenosis, stricture, ulcerations or varices in       the entire esophagus.      Localized mild inflammation  characterized by erythema was found in the       gastric antrum. Biopsies were taken with a cold forceps for Helicobacter       pylori testing.      The duodenal bulb, first portion of the duodenum and second portion of       the duodenum were normal. Biopsies for histology were taken with a cold       forceps for evaluation of celiac disease.      The Z-line was regular and was found 41 cm from the incisors. Impression:               - Gastritis. Biopsied.                           - Normal duodenal bulb, first portion of the                            duodenum and second portion of the duodenum.                            Biopsied.                           - Z-line regular, 41 cm from the incisors. Moderate Sedation:      Per Anesthesia Care Recommendation:           - Patient has a contact number available for                            emergencies. The signs and symptoms of potential                            delayed complications were discussed with the                            patient. Return to normal activities tomorrow.                            Written discharge instructions were provided to the                            patient.                           - Resume previous diet.                           - Continue present medications.                           -  Await pathology results.                           - Return to GI clinic PRN. Procedure Code(s):        --- Professional ---                           [phone], Esophagogastroduodenoscopy, flexible,                            transoral; with biopsy, single or multiple Diagnosis Code(s):        --- Professional ---                           K29.70, Gastritis, unspecified, without bleeding                           D50.9, Iron deficiency anemia, unspecified CPT copyright 2019 American Medical Association. All rights reserved. The codes documented in this report are preliminary and upon coder review may  be revised  to meet current compliance requirements. Elon Alas. Abbey Chatters, [place] Abbey Chatters, DO 09/09/2019 8:54:13 AM This report has been signed electronically. Number of Addenda: 0

## 2019-09-09 NOTE — Telephone Encounter (Signed)
-----   Message from [name], DO sent at 09/09/2019  9:37 AM EDT ----- I just finsihed scoping this patient for iron deficiency anemia.  Both his EGD and colonoscopy were largely unremarkable.  Can we set Travis Evans up for small bowel capsule endoscopy ([facility]).  Thank you!

## 2019-09-09 NOTE — Discharge Instructions (Addendum)
EGD Discharge instructions Please read the instructions outlined below and refer to this sheet in the next few weeks. These discharge instructions provide you with general information on caring for yourself after you leave the hospital. Your doctor may also give you specific instructions. While your treatment has been planned according to the most current medical practices available, unavoidable complications occasionally occur. If you have any problems or questions after discharge, please call your doctor. ACTIVITY  You may resume your regular activity but move at a slower pace for the next 24 hours.   Take frequent rest periods for the next 24 hours.   Walking will help expel (get rid of) the air and reduce the bloated feeling in your abdomen.   No driving for 24 hours (because of the anesthesia (medicine) used during the test).   You may shower.   Do not sign any important legal documents or operate any machinery for 24 hours (because of the anesthesia used during the test).  NUTRITION  Drink plenty of fluids.   You may resume your normal diet.   Begin with a light meal and progress to your normal diet.   Avoid alcoholic beverages for 24 hours or as instructed by your caregiver.  MEDICATIONS  You may resume your normal medications unless your caregiver tells you otherwise.  WHAT YOU CAN EXPECT TODAY  You may experience abdominal discomfort such as a feeling of fullness or "gas" pains.  FOLLOW-UP  Your doctor will discuss the results of your test with you.  SEEK IMMEDIATE MEDICAL ATTENTION IF ANY OF THE FOLLOWING OCCUR:  Excessive nausea (feeling sick to your stomach) and/or vomiting.   Severe abdominal pain and distention (swelling).   Trouble swallowing.   Temperature over 101 F (37.8 C).   Rectal bleeding or vomiting of blood.     Colonoscopy Discharge Instructions  Read the instructions outlined below and refer to this sheet in the next few weeks. These  discharge instructions provide you with general information on caring for yourself after you leave the hospital. Your doctor may also give you specific instructions. While your treatment has been planned according to the most current medical practices available, unavoidable complications occasionally occur.   ACTIVITY  You may resume your regular activity, but move at a slower pace for the next 24 hours.   Take frequent rest periods for the next 24 hours.   Walking will help get rid of the air and reduce the bloated feeling in your belly (abdomen).   No driving for 24 hours (because of the medicine (anesthesia) used during the test).    Do not sign any important legal documents or operate any machinery for 24 hours (because of the anesthesia used during the test).  NUTRITION  Drink plenty of fluids.   You may resume your normal diet as instructed by your doctor.   Begin with a light meal and progress to your normal diet. Heavy or fried foods are harder to digest and may make you feel sick to your stomach (nauseated).   Avoid alcoholic beverages for 24 hours or as instructed.  MEDICATIONS  You may resume your normal medications unless your doctor tells you otherwise.  WHAT YOU CAN EXPECT TODAY  Some feelings of bloating in the abdomen.   Passage of more gas than usual.   Spotting of blood in your stool or on the toilet paper.  IF YOU HAD POLYPS REMOVED DURING THE COLONOSCOPY:  No aspirin products for 7 days or as instructed.  No alcohol for 7 days or as instructed.   Eat a soft diet for the next 24 hours.  FINDING OUT THE RESULTS OF YOUR TEST Not all test results are available during your visit. If your test results are not back during the visit, make an appointment with your caregiver to find out the results. Do not assume everything is normal if you have not heard from your caregiver or the medical facility. It is important for you to follow up on all of your test results.    SEEK IMMEDIATE MEDICAL ATTENTION IF:  You have more than a spotting of blood in your stool.   Your belly is swollen (abdominal distention).   You are nauseated or vomiting.   You have a temperature over 101.   You have abdominal pain or discomfort that is severe or gets worse throughout the day.    Colon Polyps  Polyps are tissue growths inside the body. Polyps can grow in many places, including the large intestine (colon). A polyp may be a round bump or a mushroom-shaped growth. You could have one polyp or several. Most colon polyps are noncancerous (benign). However, some colon polyps can become cancerous over time. Finding and removing the polyps early can help prevent this. What are the causes? The exact cause of colon polyps is not known. What increases the risk? You are more likely to develop this condition if you:  Have a family history of colon cancer or colon polyps.  Are older than 15 or older than 45 if you are African American.  Have inflammatory bowel disease, such as ulcerative colitis or Crohn's disease.  Have certain hereditary conditions, such as: ? Familial adenomatous polyposis. ? Lynch syndrome. ? Turcot syndrome. ? Peutz-Jeghers syndrome.  Are overweight.  Smoke cigarettes.  Do not get enough exercise.  Drink too much alcohol.  Eat a diet that is high in fat and red meat and low in fiber.  Had childhood cancer that was treated with abdominal radiation. What are the signs or symptoms? Most polyps do not cause symptoms. If you have symptoms, they may include:  Blood coming from your rectum when having a bowel movement.  Blood in your stool. The stool may look dark red or black.  Abdominal pain.  A change in bowel habits, such as constipation or diarrhea. How is this diagnosed? This condition is diagnosed with a colonoscopy. This is a procedure in which a lighted, flexible scope is inserted into the anus and then passed into the colon to  examine the area. Polyps are sometimes found when a colonoscopy is done as part of routine cancer screening tests. How is this treated? Treatment for this condition involves removing any polyps that are found. Most polyps can be removed during a colonoscopy. Those polyps will then be tested for cancer. Additional treatment may be needed depending on the results of testing. Follow these instructions at home: Lifestyle  Maintain a healthy weight, or lose weight if recommended by your health care provider.  Exercise every day or as told by your health care provider.  Do not use any products that contain nicotine or tobacco, such as cigarettes and e-cigarettes. If you need help quitting, ask your health care provider.  If you drink alcohol, limit how much you have: ? 0-1 drink a day for women. ? 0-2 drinks a day for men.  Be aware of how much alcohol is in your drink. In the [place] one drink equals one 12 oz bottle of  beer (355 mL), one 5 oz glass of wine (148 mL), or one 1 oz shot of hard liquor (44 mL). Eating and drinking   Eat foods that are high in fiber, such as fruits, vegetables, and whole grains.  Eat foods that are high in calcium and vitamin D, such as milk, cheese, yogurt, eggs, liver, fish, and broccoli.  Limit foods that are high in fat, such as fried foods and desserts.  Limit the amount of red meat and processed meat you eat, such as hot dogs, sausage, bacon, and lunch meats. General instructions  Keep all follow-up visits as told by your health care provider. This is important. ? This includes having regularly scheduled colonoscopies. ? Talk to your health care provider about when you need a colonoscopy. Contact a health care provider if:  You have new or worsening bleeding during a bowel movement.  You have new or increased blood in your stool.  You have a change in bowel habits.  You lose weight for no known reason. Summary  Polyps are tissue growths inside  the body. Polyps can grow in many places, including the colon.  Most colon polyps are noncancerous (benign), but some can become cancerous over time.  This condition is diagnosed with a colonoscopy.  Treatment for this condition involves removing any polyps that are found. Most polyps can be removed during a colonoscopy. This information is not intended to replace advice given to you by your health care provider. Make sure you discuss any questions you have with your health care provider. Document Revised: 04/16/2017 Document Reviewed: 04/16/2017 Elsevier Patient Education  [place] EGD was largely unremarkable.  I do not see any evidence of ulcerations or evidence of bleeding.  You had a mild amount of inflammation in your stomach which I biopsied to rule out H. pylori.  I also biopsied your small bowel to rule out celiac disease which cause anemia.  Await pathology results (my office will contact you next week).  Continue on pantoprazole daily.  Your colonoscopy showed two very small polyps which I removed successfully.  No evidence of bleeding throughout the entire colon.  I recommend we repeat colonoscopy in 7 to 10 years depending on what the pathology of the polyps show.  We will make this determination next week.  For completeness sake's, I would recommend a small bowel capsule endoscopy though I understand your reservations about this.  Follow up with GI as needed.  I hope you have a great rest of your week!  Elon Alas. Abbey Chatters, D.O. Gastroenterology and Hepatology [facility]

## 2019-09-09 NOTE — Anesthesia Preprocedure Evaluation (Signed)
Anesthesia Evaluation  Patient identified by MRN, date of birth, ID band Patient awake    Reviewed: Allergy & Precautions, H&P , NPO status , Patient's Chart, lab work & pertinent test results, reviewed documented beta blocker date and time   History of Anesthesia Complications (+) PROLONGED EMERGENCE and history of anesthetic complications  Airway Mallampati: II  TM Distance: >3 FB Neck ROM: full    Dental no notable dental hx. (+) Edentulous Upper, Edentulous Lower   Pulmonary sleep apnea and Continuous Positive Airway Pressure Ventilation , former smoker,    Pulmonary exam normal breath sounds clear to auscultation       Cardiovascular Exercise Tolerance: Good negative cardio ROS   Rhythm:regular Rate:Normal     Neuro/Psych negative neurological ROS  negative psych ROS   GI/Hepatic Neg liver ROS, PUD, GERD  Medicated,  Endo/Other  negative endocrine ROS  Renal/GU negative Renal ROS  negative genitourinary   Musculoskeletal   Abdominal   Peds  Hematology  (+) Blood dyscrasia, anemia ,   Anesthesia Other Findings   Reproductive/Obstetrics negative OB ROS                             Anesthesia Physical Anesthesia Plan  ASA: III  Anesthesia Plan: General   Post-op Pain Management:    Induction:   PONV Risk Score and Plan: 2 and Propofol infusion  Airway Management Planned:   Additional Equipment:   Intra-op Plan:   Post-operative Plan:   Informed Consent: I have reviewed the patients History and Physical, chart, labs and discussed the procedure including the risks, benefits and alternatives for the proposed anesthesia with the patient or authorized representative who has indicated his/her understanding and acceptance.     Dental Advisory Given  Plan Discussed with: CRNA  Anesthesia Plan Comments:         Anesthesia Quick Evaluation

## 2019-09-09 NOTE — Transfer of Care (Signed)
Immediate Anesthesia Transfer of Care Note  Patient: Travis Evans  Procedure(s) Performed: COLONOSCOPY WITH PROPOFOL (N/A ) ESOPHAGOGASTRODUODENOSCOPY (EGD) WITH PROPOFOL (N/A ) BIOPSY  Patient Location: Endoscopy Unit  Anesthesia Type:General  Level of Consciousness: awake  Airway & Oxygen Therapy: Patient Spontanous Breathing  Post-op Assessment: Report given to RN  Post vital signs: Reviewed  Last Vitals:  Vitals Value Taken Time  BP    Temp    Pulse    Resp    SpO2      Last Pain:  Vitals:   09/09/19 0843  TempSrc:   PainSc: 0-No pain      Patients Stated Pain Goal: 9 (08/26/86 7195)  Complications: No complications documented.

## 2019-09-09 NOTE — Anesthesia Postprocedure Evaluation (Signed)
Anesthesia Post Note  Patient: Travis Evans  Procedure(s) Performed: COLONOSCOPY WITH PROPOFOL (N/A ) ESOPHAGOGASTRODUODENOSCOPY (EGD) WITH PROPOFOL (N/A ) BIOPSY  Patient location during evaluation: Endoscopy Anesthesia Type: General Level of consciousness: awake and alert and oriented Pain management: pain level controlled Vital Signs Assessment: post-procedure vital signs reviewed and stable Respiratory status: spontaneous breathing Cardiovascular status: blood pressure returned to baseline and stable Postop Assessment: no apparent nausea or vomiting Anesthetic complications: no   No complications documented.   Last Vitals:  Vitals:   09/09/19 0735 09/09/19 0912  BP: 102/72 123/79  Pulse: 65 63  Resp: 13 12  Temp: 36.9 C 36.6 C  SpO2: 97% 98%    Last Pain:  Vitals:   09/09/19 0912  TempSrc: Oral  PainSc: 0-No pain                 Kaitlinn Iversen

## 2019-09-09 NOTE — Op Note (Signed)
[facility] Patient Name: Travis Evans Procedure Date: 09/09/2019 8:54 AM MRN: [id number] Date of Birth: 03-29-1960 Attending MD: Elon Alas. Abbey Chatters DO CSN: [id number] Age: 59 Admit Type: Outpatient Procedure:                Colonoscopy Indications:              Iron deficiency anemia Providers:                Elon Alas. Abbey Chatters, DO, Jessica Boudreaux, Janeece Riggers, RN, Wynonia Musty Tech, Technician, Randa Spike, Technician Referring MD:              Medicines:                See the Anesthesia note for documentation of the                            administered medications Complications:            No immediate complications. Estimated Blood Loss:     Estimated blood loss was minimal. Procedure:                Pre-Anesthesia Assessment:                           - The anesthesia plan was to use monitored                            anesthesia care (MAC).                           After obtaining informed consent, the colonoscope                            was passed under direct vision. Throughout the                            procedure, the patient's blood pressure, pulse, and                            oxygen saturations were monitored continuously. The                            [id number]-[id number] ([id number]) scope was introduced through                            the anus and advanced to the the terminal ileum,                            with identification of the appendiceal orifice and                            IC valve. The colonoscopy was performed without  difficulty. The patient tolerated the procedure                            well. The quality of the bowel preparation was                            evaluated using the BBPS [facility] Bowel Preparation                            Scale) with scores of: Right Colon = 2 (minor                            amount of residual staining, small  fragments of                            stool and/or opaque liquid, but mucosa seen well),                            Transverse Colon = 3 (entire mucosa seen well with                            no residual staining, small fragments of stool or                            opaque liquid) and Left Colon = 3 (entire mucosa                            seen well with no residual staining, small                            fragments of stool or opaque liquid). The total                            BBPS score equals 8. The quality of the bowel                            preparation was good. Scope In: 8:56:16 AM Scope Out: 9:07:49 AM Scope Withdrawal Time: 0 hours 10 minutes 8 seconds  Total Procedure Duration: 0 hours 11 minutes 33 seconds  Findings:      The perianal and digital rectal examinations were normal.      Non-bleeding internal hemorrhoids were found during endoscopy.      A few small-mouthed diverticula were found in the sigmoid colon.      A 2 mm polyp was found in the transverse colon. The polyp was sessile.       The polyp was removed with a cold biopsy forceps. Resection and       retrieval were complete.      A 2 mm polyp was found in the rectum. The polyp was sessile. The polyp       was removed with a cold biopsy forceps. Resection and retrieval were       complete. Impression:               - Non-bleeding internal hemorrhoids.                           -  Diverticulosis in the sigmoid colon.                           - One 2 mm polyp in the transverse colon, removed                            with a cold biopsy forceps. Resected and retrieved.                           - One 2 mm polyp in the rectum, removed with a cold                            biopsy forceps. Resected and retrieved. Moderate Sedation:      Per Anesthesia Care Recommendation:           - Patient has a contact number available for                            emergencies. The signs and symptoms of potential                             delayed complications were discussed with the                            patient. Return to normal activities tomorrow.                            Written discharge instructions were provided to the                            patient.                           - Resume previous diet.                           - Continue present medications.                           - Await pathology results.                           - Repeat colonoscopy in 7-10 years for surveillance                            based on pathology results.                           - Return to GI clinic PRN. Procedure Code(s):        --- Professional ---                           [id number], Colonoscopy, flexible; with biopsy, single                            or multiple Diagnosis Code(s):        ---  Professional ---                           K64.8, Other hemorrhoids                           K63.5, Polyp of colon                           K62.1, Rectal polyp                           D50.9, Iron deficiency anemia, unspecified                           K57.30, Diverticulosis of large intestine without                            perforation or abscess without bleeding CPT copyright 2019 American Medical Association. All rights reserved. The codes documented in this report are preliminary and upon coder review may  be revised to meet current compliance requirements. Elon Alas. Abbey Chatters, [place] Abbey Chatters, DO 09/09/2019 9:13:05 AM This report has been signed electronically. Number of Addenda: 0

## 2019-09-12 ENCOUNTER — Other Ambulatory Visit: Payer: Self-pay

## 2019-09-12 LAB — SURGICAL PATHOLOGY

## 2019-09-12 NOTE — Telephone Encounter (Signed)
Lmom for pt to call me back. 

## 2019-09-13 ENCOUNTER — Other Ambulatory Visit: Payer: Self-pay

## 2019-09-13 ENCOUNTER — Encounter (HOSPITAL_COMMUNITY): Payer: Self-pay | Admitting: Internal Medicine

## 2019-09-13 NOTE — Telephone Encounter (Signed)
Message sent to Roseanne Kaufman NP to read [facility]ens.

## 2019-09-13 NOTE — Telephone Encounter (Signed)
Called pt and informed him that both his EGD and colonoscopy were largely unremarkable.  Informed him that Dr. Abbey Chatters would like to get him set up for small bowel capsule endoscopy (Givens).  Pt voiced understanding to all results and recommendations made by Dr. Abbey Chatters.  Routing to [employer].

## 2019-09-13 NOTE — Telephone Encounter (Signed)
No PA needed for Givens per AIM website.

## 2019-09-13 NOTE — Telephone Encounter (Signed)
Called pt, Givens scheduled for 09/30/19 at 7:30am, arrive at 7:00am. Orders entered. Instructions mailed.

## 2019-09-20 NOTE — Telephone Encounter (Signed)
Left a detailed message for pt. Pt was notified to d/c iron 7 days prior to apt.

## 2019-09-20 NOTE — Telephone Encounter (Signed)
Pt has an upcoming Given apt. Does pt need to d/c his iron 7 days prior to procedure? Please advise.

## 2019-09-20 NOTE — Telephone Encounter (Signed)
Yes, please stop iron 7 days prior.

## 2019-09-29 ENCOUNTER — Telehealth: Payer: Self-pay | Admitting: Internal Medicine

## 2019-09-29 NOTE — Telephone Encounter (Signed)
Patient wants to know if he will be put under for his givens capsule study. He is scheduled with Dr Abbey Chatters for tomorrow. I told him I wasn't sure and I would have to ask the scheduler. 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 I told him that she was with another patient and I would let her know and if he doesn't answer she could leave him a VM. [phone]

## 2019-09-29 NOTE — Telephone Encounter (Signed)
Called pt and answered questions.

## 2019-09-30 ENCOUNTER — Ambulatory Visit (HOSPITAL_COMMUNITY)
Admission: RE | Admit: 2019-09-30 | Discharge: 2019-09-30 | Disposition: A | Discharge status: Home / Self Care | Payer: BC Managed Care – PPO | Attending: Internal Medicine | Admitting: Internal Medicine

## 2019-09-30 ENCOUNTER — Encounter (HOSPITAL_COMMUNITY)
Admission: RE | Disposition: A | Discharge status: Home / Self Care | Payer: Self-pay | Source: Home / Self Care | Attending: Internal Medicine

## 2019-09-30 DIAGNOSIS — D508 Other iron deficiency anemias: Secondary | ICD-10-CM

## 2019-09-30 DIAGNOSIS — D509 Iron deficiency anemia, unspecified: Secondary | ICD-10-CM | POA: Diagnosis not present

## 2019-09-30 DIAGNOSIS — D369 Benign neoplasm, unspecified site: Secondary | ICD-10-CM | POA: Insufficient documentation

## 2019-09-30 DIAGNOSIS — R195 Other fecal abnormalities: Secondary | ICD-10-CM | POA: Insufficient documentation

## 2019-09-30 HISTORY — PX: GIVENS CAPSULE STUDY: SHX5432

## 2019-09-30 SURGERY — IMAGING PROCEDURE, GI TRACT, INTRALUMINAL, VIA CAPSULE

## 2019-10-03 ENCOUNTER — Telehealth: Payer: Self-pay | Admitting: Gastroenterology

## 2019-10-03 ENCOUNTER — Encounter: Payer: Self-pay | Admitting: Internal Medicine

## 2019-10-03 NOTE — Procedures (Signed)
Small Bowel [facility] Capsule Study Procedure date:  09/30/19  Referring Provider:  Dr. Abbey Chatters PCP:  Dr. Josem Kaufmann, MD  Indication for procedure:   59 year old male with history of IDA, heme positive stool, s/p EGD with unremarkable biopsies, negative celiac sprue, colonoscopy with tubular adenoma. Capsule study now indicated to rule out small bowel source.    Findings:   Capsule study was complete to the cecum. No obvious mass, tumor, AVMs. Scattered lymphangiectasias noted. Distal small bowel with more irritated/"angry" appearing mucosa but no outright erosions or ulcerations.   First Gastric image:  00:01:19 First Duodenal image: Approximately 00:29:31 First Cecal image: 04:56:52 Gastric Passage time: 0h 80m Small Bowel Passage time:  4h 56m  Summary & Recommendations: Pleasant 59 year old male with history of IDA, s/p colonoscopy, EGD, and now capsule study without obvious source. Possible scattered lymphangiectasias noted, and distal small bowel appeared slightly erythematous but no outright erosions or ulcerations noted. Ibuprofen is taking prn for joint pain/headaches. Would avoid NSAIDs if at all possible going forward. Continue with Hematology follow-up. Return to office in 3 months.    Annitta Needs, PhD, ANP-BC [facility]

## 2019-10-03 NOTE — Telephone Encounter (Signed)
I reviewed capsule study. Nothing outright to contribute to IDA seen on images. Please have him avoid NSAIDs if at all possible, keep Hematology follow-up. Needs office visit with [place] in 3 months. Thanks!

## 2019-10-04 NOTE — Telephone Encounter (Signed)
Called pt and informed him of Roseanne Kaufman, NP's results and recommendations.  Pt wants to know if there is any other tests that we can order to try to pinpoint the reason for bleeding.  He said that he will keep Hematology f/u.  He confirmed his 3 mo appt.

## 2019-10-05 ENCOUNTER — Encounter (HOSPITAL_COMMUNITY): Payer: Self-pay | Admitting: Internal Medicine

## 2019-10-07 NOTE — Telephone Encounter (Signed)
Unless overt bleeding, we will monitor blood work. It is reassuring that his entire GI tract has been assessed.

## 2019-10-07 NOTE — Telephone Encounter (Signed)
Called pt and informed him of Roseanne Kaufman, NP's recommendations.  Pt voiced understanding.

## 2020-01-03 ENCOUNTER — Other Ambulatory Visit: Payer: Self-pay

## 2020-01-03 ENCOUNTER — Ambulatory Visit (INDEPENDENT_AMBULATORY_CARE_PROVIDER_SITE_OTHER): Payer: BC Managed Care – PPO | Admitting: Nurse Practitioner

## 2020-01-03 ENCOUNTER — Encounter: Payer: Self-pay | Admitting: Nurse Practitioner

## 2020-01-03 VITALS — BP 114/75 | HR 70 | Temp 97.1°F | Ht 70.0 in | Wt 219.8 lb

## 2020-01-03 DIAGNOSIS — R195 Other fecal abnormalities: Secondary | ICD-10-CM

## 2020-01-03 DIAGNOSIS — K648 Other hemorrhoids: Secondary | ICD-10-CM | POA: Diagnosis not present

## 2020-01-03 DIAGNOSIS — D509 Iron deficiency anemia, unspecified: Secondary | ICD-10-CM

## 2020-01-03 DIAGNOSIS — Z0000 Encounter for general adult medical examination without abnormal findings: Secondary | ICD-10-CM

## 2020-01-03 NOTE — Progress Notes (Signed)
Referring Provider: Josem Kaufmann, MD Primary Care Physician:  Josem Kaufmann, MD Primary GI:  Dr. Abbey Chatters  Chief Complaint  Patient presents with  . Anemia    Occ feels tired. Taking Iron    HPI:   Travis Evans is a 59 y.o. male who presents for follow-up. Patient was last seen in our office 08/30/2019 for IDA and heme positive stool. He was initially referred by [facility]-medical oncology and hematology due to IDA and heme positive stools. Remote colonoscopy by Dr. Britta Mccreedy in [place] but records not available. Thinks he had an EGD over 10 years ago and was told he had PUD.  Outside labs 08/18/2019 include hemoglobin 12.4, platelets 261, ferritin 9.  As last visit noted taking oral iron with subsequent dark stools. Heme positive. Notes occasional bright red blood per rectum when wiping but no straining. Having some sluggishness as well. Referred to hematology by primary care when noted IDA. Vague upper abdominal discomfort without precipitating or alleviating factors. Occasional diarrhea but not daily. Denied weight loss, dysphagia. Notes chronic GERD on Protonix since 2006 with vague nausea. Uses ibuprofen as needed for joint pain/headaches. Otherwise feels bloated with lots of gas pain, daily bowel movement which he feels is productive. Concerned about an area in his rectum. On exam the concerning area with no obvious fissure and apparent skin tag in the posterior midline. Recommended proceed with colonoscopy and endoscopy and consider capsule study if negative endoscopic evaluation. Further recommendations to follow.  Colonoscopy completed 09/09/2019 which found nonbleeding internal hemorrhoids, few small mouth diverticula in the sigmoid colon, 2 mm polyp in the transverse colon that was sessile, 2 mm polyp in the rectum that was sessile. Surgical pathology found the transverse colon polyp to be tubular adenoma, the rectal polyp to be hyperplastic. Recommended repeat colonoscopy in 7  years.  EGD completed the same day found gastritis status post biopsy, normal duodenum. Surgical pathology, biopsies to be antral and oxyntic mucosa with slight chronic inflammation but negative for H. pylori, duodenal biopsy found benign duodenal mucosa.  Recommended small bowel capsule endoscopy due to unremarkable findings on endoscopic exam. This was completed 09/30/2019 and found no obvious source for bleeding. Possible scattered lymphangiectasia is noted with distal small bowel appearing slightly erythematous but no obvious erosions or ulcerations. Culprit could be ongoing NSAID use. Recommended avoid NSAIDs if at all possible continue with hematology follow-up, follow-up in our office in 3 months.  Today he states he doing okay overall. He does note persistent fatigue. He is still taking iron as recommended/managed by hematology. We reviewed his studies in depth. Hasn't been back to heme/onc in a while, been a while since labs were last checked. Still with chronic fatigue, maybe mild lightheadedness rarely. Otherwise denies significant dyspnea, chest pain. Denies hematochezia. Does have dark stools on iron. Rarely has scant tissue hematochezia with known internal hemorrhoids. Denies abdominal pain, N/V, fever, chills, unintentional weight loss. Denies URI or flu-like symptoms. Denies loss of sense of taste or smell. The patient has not received COVID-19 vaccination(s). Denies chest pain, dyspnea, dizziness, lightheadedness, syncope, near syncope. Denies any other upper or lower GI symptoms.  Past Medical History:  Diagnosis Date  . 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 Complication of anesthesia    difficulty waking x 1  . DJD (degenerative joint disease)    left hip  . Full  dentures   . Gastroesophageal reflux   . Lower back injury   . Peptic ulcer    in 2006, thought to be due to medications per patient?    . Sleep apnea    wears CPAP set on 8  . Thyroid disease     Past Surgical History:  Procedure Laterality Date  . BIOPSY  09/09/2019   Procedure: BIOPSY;  Surgeon: [name], DO;  Location: [facility];  Service: Endoscopy;;  . CARPAL TUNNEL RELEASE    . COLONOSCOPY WITH ESOPHAGOGASTRODUODENOSCOPY (EGD)    . COLONOSCOPY WITH PROPOFOL N/A 09/09/2019   Procedure: COLONOSCOPY WITH PROPOFOL;  Surgeon: [name], DO;  Location: [facility];  Service: Endoscopy;  Laterality: N/A;  2:30pm  . CYST REMOVAL NECK    . ESOPHAGOGASTRODUODENOSCOPY (EGD) WITH PROPOFOL N/A 09/09/2019   Procedure: ESOPHAGOGASTRODUODENOSCOPY (EGD) WITH PROPOFOL;  Surgeon: [name], DO;  Location: [facility];  Service: Endoscopy;  Laterality: N/A;  . EYE SURGERY     cornea surgery  . GIVENS CAPSULE STUDY N/A 09/30/2019   Procedure: GIVENS CAPSULE STUDY;  Surgeon: [name], DO;  Location: [facility];  Service: Endoscopy;  Laterality: N/A;  7:30am  . JOINT REPLACEMENT    . LEFT HEART CATH AND CORONARY ANGIOGRAPHY N/A 04/07/2016   Procedure: Left Heart Cath and Coronary Angiography;  Surgeon: Troy Sine, MD;  Location: [facility];  Service: Cardiovascular;  Laterality: N/A;  . MULTIPLE TOOTH EXTRACTIONS    . TONSILLECTOMY    . 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 TOTAL HIP ARTHROPLASTY Right 08/10/2018   Procedure: TOTAL HIP ARTHROPLASTY ANTERIOR APPROACH;  Surgeon: Melrose Nakayama, MD;  Location: [facility];  Service: Orthopedics;  Laterality: Right;  . UMBILICAL HERNIA REPAIR      Current Outpatient Medications  Medication Sig Dispense Refill  . Ascorbic Acid (VITAMIN C) 1000 MG tablet Take 1,000 mg by mouth daily as needed (immune support (winter time)).    Marland Kitchen azelastine (ASTELIN) 0.1 % nasal spray Place 1 spray into both  nostrils daily as needed for allergies. Use in each nostril as directed    . B Complex-C (SUPER B COMPLEX PO) Take 1 capsule by mouth daily at 12 noon.     . Cholecalciferol (VITAMIN D3) 125 MCG (5000 UT) TABS Take 5,000 Units by mouth daily at 12 noon.     Marland Kitchen EPINEPHrine 0.3 mg/0.3 mL IJ SOAJ injection Inject 0.3 mg into the muscle once as needed (allergic reaction).    . FERROCITE 324 MG TABS tablet Take 324 mg of iron by mouth daily.     Marland Kitchen loratadine (CLARITIN) 10 MG tablet Take 10 mg by mouth at bedtime.     . pantoprazole (PROTONIX) 40 MG tablet Take 40 mg by mouth at bedtime.     . tadalafil (CIALIS) 5 MG tablet Take 5 mg by mouth daily.    . traZODone (DESYREL) 50 MG tablet Take 50 mg by mouth daily.     . White Petrolatum-Mineral Oil (LUBRICANT EYE NIGHTTIME) OINT Place 1 application into the right eye at bedtime.    . Zinc 30 MG CAPS Take 30 mg by mouth daily at 12 noon.     No current facility-administered medications for this visit.    Allergies as of 01/03/2020 - Review Complete 01/03/2020  Allergen  Reaction Noted  . Hibiclens [chlorhexidine] Itching and Rash 08/06/2018    Family History  Problem Relation Age of Onset  . Pancreatic cancer Mother   . Stroke Father   . Pancreatic cancer Nephew 37  . Colon cancer Neg Hx     Social History   Socioeconomic History  . Marital status: Married    Spouse name: Not on file  . Number of children: Not on file  . Years of education: Not on file  . Highest education level: Not on file  Occupational History  . Not on file  Tobacco Use  . Smoking status: Former Smoker    Packs/day: 3.00    Types: Cigarettes    Quit date: 04/03/2012    Years since quitting: 7.7  . Smokeless tobacco: Former Systems developer    Types: Secondary school teacher  . Vaping Use: Never used  Substance and Sexual Activity  . Alcohol use: Yes    Comment: occasional beer, mixed drink  . Drug use: No  . Sexual activity: Not on file  Other Topics Concern  . Not on file   Social History Narrative  . Not on file   Social Determinants of Health   Financial Resource Strain: Not on file  Food Insecurity: Not on file  Transportation Needs: Not on file  Physical Activity: Not on file  Stress: Not on file  Social Connections: Not on file    Subjective: Review of Systems  Constitutional: Positive for malaise/fatigue. Negative for chills, fever and weight loss.  HENT: Negative for congestion and sore throat.   Respiratory: Positive for shortness of breath (with significant exertion). Negative for cough.   Cardiovascular: Negative for chest pain and palpitations.  Gastrointestinal: Positive for blood in stool. Negative for abdominal pain, constipation, diarrhea, heartburn, melena, nausea and vomiting.       Dark stools on iron  Musculoskeletal: Negative for joint pain and myalgias.  Skin: Negative for rash.  Neurological: Negative for dizziness and weakness.  Endo/Heme/Allergies: Does not bruise/bleed easily.  Psychiatric/Behavioral: Negative for depression. The patient is not nervous/anxious.   All other systems reviewed and are negative.    Objective: BP 114/75   Pulse 70   Temp (!) 97.1 F (36.2 C) (Temporal)   Ht 5\' 10"  (1.778 m)   Wt 219 lb 12.8 oz (99.7 kg)   BMI 31.54 kg/m  Physical Exam Vitals and nursing note reviewed.  Constitutional:      General: He is not in acute distress.    Appearance: Normal appearance. He is obese. He is not ill-appearing, toxic-appearing or diaphoretic.  HENT:     Head: Normocephalic and atraumatic.     Nose: No congestion or rhinorrhea.  Eyes:     General: No scleral icterus. Cardiovascular:     Rate and Rhythm: Normal rate and regular rhythm.     Heart sounds: Normal heart sounds.  Pulmonary:     Effort: Pulmonary effort is normal.     Breath sounds: Normal breath sounds.  Abdominal:     General: Bowel sounds are normal. There is no distension.     Palpations: Abdomen is soft. There is no  hepatomegaly, splenomegaly or mass.     Tenderness: There is no abdominal tenderness. There is no guarding or rebound.     Hernia: No hernia is present.  Musculoskeletal:     Cervical back: Neck supple.  Skin:    General: Skin is warm and dry.     Coloration: Skin is pale (  mild). Skin is not jaundiced.     Findings: No bruising or rash.  Neurological:     General: No focal deficit present.     Mental Status: He is alert and oriented to person, place, and time. Mental status is at baseline.  Psychiatric:        Mood and Affect: Mood normal.        Behavior: Behavior normal.        Thought Content: Thought content normal.      Assessment:  Pleasant 59 year old male presents for follow-up on IDA.  He does note some ongoing fatigue and symptoms suggestive of persistent IDA.  Significant endoscopic evaluation including EGD, colonoscopy, small bowel capsule endoscopy as noted in HPI.  No significant, major findings for his anemia were identified.  Possible minor contributors include mild ileal erythema (without obvious erosions or ulcerations), ongoing NSAID use: Scattered lymphangiectasia's without active bleeding, nonbleeding internal hemorrhoids.  Overall he is doing well without any red flag/warning signs or symptoms.  IDA: He does appear mildly pale today, does have persistent fatigue but no severe symptoms.  Occasional dyspnea with significant exertion.  He has not had his labs checked in 2 to 3 months.  I will check a CBC, ferritin, iron studies today to evaluate where we are at.  Given no significant findings I recommended he continue to follow-up with hematology/oncology for management.  Continue iron for now.  Preventative care: The patient dates he has never been screened for hepatitis C.  We discussed CDC recommendations for one-time screening for all adults (an additional ongoing screening for high risk behaviors, which he has none).  He is agreeable to hepatitis C serologies at the  time of his other labs.  I printed off information for him regarding these recommendations.   Plan: 1. CBC, iron, ferritin, hepatitis C antibody 2. Continue current medications 3. Printed hepatitis C information related to screening 4. Follow-up with hematology/oncology 5. Follow-up in our office in 6 months 6. Call for any obvious or worsening bleeding 7. Call for any worsening anemia symptoms.    Thank you for allowing [place] to participate in the care of VISENTE KIRKER  Walden Field, DNP, AGNP-C Adult & Gerontological Nurse Practitioner [facility]   01/03/2020 11:18 AM   Disclaimer: This note was dictated with voice recognition software. Similar sounding words can inadvertently be transcribed and may not be corrected upon review.

## 2020-01-03 NOTE — Patient Instructions (Addendum)
Your health issues we discussed today were:   Iron deficiency anemia (blood loss, likely chronic with low iron): 1. Have your labs checked as soon as you can 2. We will call you with results 3. Keep your follow-up appointment with hematology to manage her anemia 4. Continue taking your iron for now 5. If you become significantly weak, have worsening dizziness, lightheadedness, passing out, nearly passing out then proceed to the emergency department 6. Call us if you have any obvious bleeding in your stools that is worse than the mild blood on the tissue  Preventative care: 1. Have your hepatitis C lab checked when you get your other labs checked 2. We will call you with results 3. I have printed further information about hepatitis C below  Overall I recommend:  1. Continue other current medications 2. Return for follow-up in 6 months 3. Call us for any questions or concerns   ---------------------------------------------------------------  At [facility] we value your feedback. You may receive a survey about your visit today. Please share your experience as we strive to create trusting relationships with our patients to provide genuine, compassionate, quality care.  We appreciate your understanding and patience as we review any laboratory studies, imaging, and other diagnostic tests that are ordered as we care for you. Our office policy is 5 business days for review of these results, and any emergent or urgent results are addressed in a timely manner for your best interest. If you do not hear from our office in 1 week, please contact us.   We also encourage the use of MyChart, which contains your medical information for your review as well. If you are not enrolled in this feature, an access code is on this after visit summary for your convenience. Thank you for allowing [place] to be involved in your care.  It was great to see you today!  I hope you have a Merry Christmas and  Happy Holidays!!    ---------------------------------------------------------------     Hepatitis C Hepatitis C is a liver infection that is caused by the hepatitis C virus (HCV). The virus infects and causes inflammation in the liver. Hepatitis C can lead to:  A condition where the liver cannot work anymore (liver failure).  Scarring of the liver (cirrhosis).  Liver cancer. People with hepatitis C often do not know for months or years that they have it. This is because they often do not have symptoms or may have only mild symptoms. What are the causes? This condition is caused by HCV. The virus can spread from person to person (is contagious) through:  Contact with an infected person's blood, semen, or vaginal fluids.  Childbirth. A woman who has hepatitis C can pass it to her baby during birth.  Donated blood (blood transfusion) or a donated body organ (organ transplantation) if received in the [place] before 1992. What increases the risk? The following factors may make you more likely to develop this condition:  Having contact with needles or syringes that have HCV on them (are contaminated). Contact may happen while: ? Receiving acupuncture. ? Getting a tattoo. ? Getting a body piercing. ? Injecting drugs.  Having sex with someone who is infected. The virus can spread through vaginal, oral, or anal sex.  Receiving treatment to filter your blood (kidney dialysis).  Having HIV (human immunodeficiency virus) or AIDS (acquired immunodeficiency syndrome).  Having a job that involves contact with blood or certain other body fluids, such as in health care. What are  the signs or symptoms? Symptoms of this condition include:  Tiredness (fatigue).  Loss of appetite.  Nausea or vomiting.  Pain in your abdomen.  Dark yellow urine.  Your skin or the white parts of your eyes turning yellow (jaundice).  Itchy skin.  Light-colored or tan stool.  Joint  pain.  Bleeding and bruising that happen often.  Fluid building up in your stomach (ascites). Often, hepatitis C causes no symptoms. How is this diagnosed? This condition is diagnosed with:  Blood tests.  Other tests of how well your liver is working. These may include: ? Magnetic resonance elastography (MRE). This imaging test uses MRI and sound waves to measure liver stiffness. ? Transient elastography. This imaging test uses ultrasound to measure liver stiffness. ? Liver biopsy. This test involves taking a tissue sample from your liver to look at under a microscope. How is this treated? Treatment may depend on how severe your condition is, how long it has lasted, and whether you have liver damage. More testing may be done to figure out the best treatment. Treatment may include:  Taking antiviral medicines and other medicines.  Having follow-up treatments every 6-12 months for infections or other liver problems.  Having liver transplantation. Follow these instructions at home: Medicines  Take over-the-counter and prescription medicines only as told by your health care provider.  If you were prescribed an antiviral medicine, take it as told by your health care provider. Do not stop using the antiviral even if you start to feel better.  Do not take any new medicines, including over-the-counter medicines or supplements, unless your health care provider approves. Activity  Rest as needed.  Do not have sex unless approved by your health care provider.  Return to your normal activities as told by your health care provider. Ask your health care provider what activities are safe for you.  Ask your health care provider when you may return to school or work. Eating and drinking   Eat a balanced diet with plenty of fruits and vegetables, whole grains, and lean meats or non-meat proteins (such as beans or tofu).  Drink enough fluids to keep your urine pale yellow.  Do not drink  alcohol. General instructions  Do not share toothbrushes, nail clippers, or razors.  Wash your hands often with soap and water for at least 20 seconds. If soap and water are not available, use hand sanitizer.  Cover any cuts or open sores on your skin to prevent spreading HCV.  Avoid swimming or using hot tubs if you have open sores or wounds.  Keep all follow-up visits as told by your health care provider. This is important. You may need follow-up visits every 6-12 months. How is this prevented? There is no vaccine for hepatitis C. The only way to prevent this infection is to lessen your risk of coming into contact with HCV. Make sure you:  Wash your hands often with soap and water for at least 20 seconds.  Do not share needles or syringes.  Use a condom every time you have vaginal, oral, or anal sex. Be sure to use it correctly each time. ? Both females and males should wear condoms. ? Condoms should be kept in place from the beginning to the end of sexual activity. ? Latex condoms should be used, if possible. These offer the best protection.  Avoid handling blood or other body fluids without gloves or other protection.  Avoid getting tattoos or body piercings in shops or other places that  are not clean. Where to find more information  Centers for Disease Control and Prevention: [employer] Contact a health care provider if you:  Have a fever.  Have pain in your abdomen.  Pass dark urine.  Pass light-colored or tan stool.  Have joint pain. Get help right away if you:  Have an increase in fatigue or weakness.  Lose your appetite.  Cannot eat or drink without vomiting.  Develop jaundice or your jaundice gets worse.  Bruise or bleed easily. Summary  Hepatitis C is a liver infection that is caused by the hepatitis C virus (HCV). This infection can lead to a condition where the liver cannot work anymore (liver failure), scarring of the liver (cirrhosis),  or liver cancer.  HCV causes this condition and can spread from person to person (is contagious).  Do not take any medicines, including over-the-counter medicines or supplements, unless your health care provider approves. This information is not intended to replace advice given to you by your health care provider. Make sure you discuss any questions you have with your health care provider. Document Revised: 09/22/2018 Document Reviewed: 08/30/2018 Elsevier Patient Education  [place].      Hepatitis C Testing Why am I having this test? Hepatitis C testing is done to check for a liver infection caused by the hepatitis C virus (HCV). You may have one or more hepatitis C tests done:  To help your health care provider diagnose HCV infection, if you have possible signs or symptoms of infection.  To check for infection, if you may have been exposed to HCV.  To find the cause of long-term (chronic) liver disease or abnormal liver function test results.  To see if you have had hepatitis C in the past. Hepatitis C is usually diagnosed with three blood tests:  Anti-HCV test, also called the HCV antibody test.  HCV RNA test.  HCV genotype test. If you are diagnosed with a current (active) HCV infection, you may have another test done to help monitor your condition during treatment. This test is called the quantitative HCV RNA test. What is being tested? Each HCV test measures the amounts of different substances in your blood.  The anti-HCV test checks for proteins that your body makes to fight HCV (antibodies). If you have antibodies to HCV, it means you have been infected with hepatitis C. It does not necessarily mean that you have an active infection.  The HCV RNA test checks for genetic material from HCV. This test is done if your HCV antibody test is positive and your health care provider wants to find out if you have an active infection.  The HCV genotype test. This test  identifies the type (genotype) of virus you have.  The quantitative HCV RNA test measures the amount of virus in your blood (viral load). What kind of sample is taken?  A blood sample is required for HCV tests. It is usually collected by inserting a needle into a blood vessel. How are the results reported?  Anti-HCV test results are reported as either positive or negative for HCV antibodies.  HCV RNA test results are reported as either positive or negative for HCV genetic material.  HCV genotype test results are reported as which genotype of the virus you have. Genotypes are numbered 1 through 6.  Quantitative HCV RNA test results are reported as a number that indicates your viral load. This is given as international units of virus per milliliter of blood (IU/mL). ? A result  of 800,000 IU/mL or greater is considered a high viral load. ? A result of less than 800,000 IU/mL is considered a low viral load. Sometimes, results from the anti-HCV test or the HCV RNA test may report that:  HCV antibodies or genetic material are present when they are not present (false-positive result).  HCV antibodies or genetic material are not present when they are present (false-negative result). What do the results mean? For the anti-HCV test:  A negative result may mean that you have not been infected with HCV. You may need to have this test done again to confirm this result.  A positive result may mean that you have an active HCV infection, or that you have been infected with HCV in the past. An HCV infection may not cause any symptoms, and your body may get rid of the virus without treatment. For the HCV RNA test:  A negative result means that you do not have an active HCV infection.  A positive result means that you have an active HCV infection. For the HCV genotype test, knowing the specific genotype you have will help your health care provider recommend the treatment that will work best for  you. The quantitative HCV RNA test gives your health care provider an idea of how well your treatment is working.  If your viral load is high, you may need different treatment.  If your viral load is low, your treatment may be working effectively.  You may have this test repeated to continue to monitor your treatment. Talk with your health care provider about what your results mean. Questions to ask your health care provider Ask your health care provider, or the department that is doing the test:  When will my results be ready?  How will I get my results?  What are my treatment options?  What other tests do I need?  What are my next steps? Summary  The hepatitis C testing is done to check for a liver infection caused by the hepatitis C virus (HCV).  Hepatitis C is usually diagnosed with three blood tests and monitored with one test.  A blood sample is required for these tests. It is usually collected by inserting a needle into a blood vessel.  Your test results for both the anti-HCV test and the HCV RNA test will be reported as either positive or negative. This information is not intended to replace advice given to you by your health care provider. Make sure you discuss any questions you have with your health care provider. Document Revised: 12/12/2016 Document Reviewed: 11/17/2016 Elsevier Patient Education  2020 [employer].

## 2020-01-03 NOTE — Progress Notes (Signed)
Cc'ed to pcp °

## 2020-01-10 LAB — CBC WITH DIFFERENTIAL/PLATELET
Absolute Monocytes: 371 cells/uL (ref 200–950)
Basophils Absolute: 29 cells/uL (ref 0–200)
Basophils Relative: 0.5 %
Eosinophils Absolute: 217 cells/uL (ref 15–500)
Eosinophils Relative: 3.8 %
HCT: 38.5 % (ref 38.5–50.0)
Hemoglobin: 13 g/dL — ABNORMAL LOW (ref 13.2–17.1)
Lymphs Abs: 2354 cells/uL (ref 850–3900)
MCH: 29.2 pg (ref 27.0–33.0)
MCHC: 33.8 g/dL (ref 32.0–36.0)
MCV: 86.5 fL (ref 80.0–100.0)
MPV: 10.7 fL (ref 7.5–12.5)
Monocytes Relative: 6.5 %
Neutro Abs: 2730 cells/uL (ref 1500–7800)
Neutrophils Relative %: 47.9 %
Platelets: 242 10*3/uL (ref 140–400)
RBC: 4.45 10*6/uL (ref 4.20–5.80)
RDW: 13.7 % (ref 11.0–15.0)
Total Lymphocyte: 41.3 %
WBC: 5.7 10*3/uL (ref 3.8–10.8)

## 2020-01-10 LAB — IRON, TOTAL/TOTAL IRON BINDING CAP
%SAT: 18 % (calc) — ABNORMAL LOW (ref 20–48)
Iron: 61 ug/dL (ref 50–180)
TIBC: 335 mcg/dL (calc) (ref 250–425)

## 2020-01-10 LAB — FERRITIN: Ferritin: 31 ng/mL — ABNORMAL LOW (ref 38–380)

## 2020-01-10 LAB — HEPATITIS C GENOTYPE: HCV Genotype: NOT DETECTED

## 2020-01-24 ENCOUNTER — Telehealth: Payer: Self-pay | Admitting: Internal Medicine

## 2020-01-24 NOTE — Telephone Encounter (Signed)
Patient called asking about his lab results   [phone]

## 2020-01-24 NOTE — Telephone Encounter (Signed)
Returned the pt's call and advised him that the Dr has not released the results yet but he will be in tomorrow and I will have him to look at them. The pt was ok with waiting until tomorrow.

## 2020-07-03 ENCOUNTER — Encounter: Payer: Self-pay | Admitting: Gastroenterology

## 2020-07-03 ENCOUNTER — Ambulatory Visit: Payer: BC Managed Care – PPO | Admitting: Gastroenterology

## 2020-07-03 ENCOUNTER — Other Ambulatory Visit: Payer: Self-pay

## 2020-07-03 ENCOUNTER — Ambulatory Visit: Payer: BC Managed Care – PPO | Admitting: Nurse Practitioner

## 2020-07-03 VITALS — BP 117/71 | HR 69 | Temp 97.1°F | Ht 70.0 in | Wt 230.0 lb

## 2020-07-03 DIAGNOSIS — D509 Iron deficiency anemia, unspecified: Secondary | ICD-10-CM

## 2020-07-03 NOTE — Progress Notes (Signed)
Referring Provider: Josem Kaufmann, MD Primary Care Physician:  Josem Kaufmann, MD Primary GI: Dr. Abbey Chatters  Chief Complaint  Patient presents with   Anemia    Doing ok    HPI:   Travis Evans is a 60 y.o. male presenting today with a history of IDA initially noted last year, with ferritin markedly low at 9. Hgb at that time 12.4. History of chronic GERD. He has undergone colonoscopy, EGD, and capsule study. Negative H.pylori on EGD. Capsule with possible scattered lymphangiectasia but no obvious erosions or ulcerations, query NSAID injury contributing. Due to history of polyps, surveillance colonoscopy recommended in 2028.  Last labs Dec 2021 with Hgb 13, ferritin 31. No abdominal pain, N/V, overt GI bleeding, dysphagia, weight loss, lack of appetite. Will occasionally take an Ibuprofen or similar due to joint pain. Protonix daily. Not taking iron any longer as he ran out.   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    Sleep apnea    wears CPAP set on 8   Thyroid disease     Past Surgical History:  Procedure Laterality Date   BIOPSY  09/09/2019   Procedure: BIOPSY;  Surgeon: [name], DO;  Location: [facility];  Service: Endoscopy;;   CARPAL TUNNEL RELEASE     COLONOSCOPY WITH ESOPHAGOGASTRODUODENOSCOPY (EGD)     COLONOSCOPY WITH PROPOFOL N/A 09/09/2019   non-bleeding internal hemorrhoids, sigmoid diverticula, sessile 2 mm polyp in transverse colon and 2 mm sessile rectal polyp (Tubular adenoma and hyperplastic). Surveillance in 2028   CYST REMOVAL NECK     ESOPHAGOGASTRODUODENOSCOPY (EGD) WITH  PROPOFOL N/A 09/09/2019   gastritis, normal duodenum, negative H.pylori, negtiave celiac sprue   EYE SURGERY     cornea surgery   GIVENS CAPSULE STUDY N/A 09/30/2019   possible scattered lymphangiectasias, no obviou erosions or ulcerations, distal small bowel slightly erythematous   JOINT REPLACEMENT     LEFT HEART CATH AND CORONARY ANGIOGRAPHY N/A 04/07/2016   Procedure: Left Heart Cath and Coronary Angiography;  Surgeon: Troy Sine, MD;  Location: [facility];  Service: Cardiovascular;  Laterality: N/A;   MULTIPLE TOOTH EXTRACTIONS     TONSILLECTOMY     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     azelastine (ASTELIN) 0.1 % nasal spray Place 1 spray into both nostrils daily as needed for allergies. Use in each nostril as directed     B Complex-C (SUPER B COMPLEX PO) Take 1 capsule  by mouth daily at 12 noon.      Cholecalciferol (VITAMIN D3) 125 MCG (5000 UT) TABS Take 5,000 Units by mouth daily at 12 noon.      EPINEPHrine 0.3 mg/0.3 mL IJ SOAJ injection Inject 0.3 mg into the muscle once as needed (allergic reaction).     loratadine (CLARITIN) 10 MG tablet Take 10 mg by mouth at bedtime.      pantoprazole (PROTONIX) 40 MG tablet Take 40 mg by mouth at bedtime.      tadalafil (CIALIS) 5 MG tablet Take 5 mg by mouth daily.     traZODone (DESYREL) 50 MG tablet Take 50 mg by mouth daily.      VITAMIN E PO Take by mouth daily.     White  Petrolatum-Mineral Oil (LUBRICANT EYE NIGHTTIME) OINT Place 1 application into the right eye at bedtime.     Zinc 30 MG CAPS Take 30 mg by mouth daily at 12 noon.     No current facility-administered medications for this visit.    Allergies as of 07/03/2020 - Review Complete 07/03/2020  Allergen Reaction Noted   Hibiclens [chlorhexidine] Itching and Rash 08/06/2018    Family History  Problem Relation Age of Onset   Pancreatic cancer Mother    Stroke Father    Pancreatic cancer Nephew 37   Colon cancer Neg Hx     Social History   Socioeconomic History   Marital status: Married    Spouse name: Not on file   Number of children: Not on file   Years of education: Not on file   Highest education level: Not on file  Occupational History   Not on file  Tobacco Use   Smoking status: Former    Packs/day: 3.00    Pack years: 0.00    Types: Cigarettes    Quit date: 04/03/2012    Years since quitting: 8.2   Smokeless tobacco: Former    Types: Nurse, children's Use: Never used  Substance and Sexual Activity   Alcohol use: Yes    Comment: occasional beer, mixed drink   Drug use: No   Sexual activity: Not on file  Other Topics Concern   Not on file  Social History Narrative   Not on file   Social Determinants of Health   Financial Resource Strain: Not on file  Food Insecurity: Not on file  Transportation Needs: Not on file  Physical Activity: Not on file  Stress: Not on file  Social Connections: Not on file    Review of Systems: Gen: Denies fever, chills, anorexia. Denies fatigue, weakness, weight loss.  CV: Denies chest pain, palpitations, syncope, peripheral edema, and claudication. Resp: Denies dyspnea at rest, cough, wheezing, coughing up blood, and pleurisy. GI: see HPI Derm: Denies rash, itching, dry skin Psych: Denies depression, anxiety, memory loss, confusion. No homicidal or suicidal ideation.  Heme: Denies bruising, bleeding, and enlarged lymph  nodes.  Physical Exam: BP 117/71   Pulse 69   Temp (!) 97.1 F (36.2 C) (Temporal)   Ht 5\' 10"  (1.778 m)   Wt 230 lb (104.3 kg)   BMI 33.00 kg/m  General:   Alert and oriented. No distress noted. Pleasant and cooperative.  Head:  Normocephalic and atraumatic. Eyes:  Conjuctiva clear without scleral icterus. Mouth:  mask in place Abdomen:  +BS, soft, non-tender and non-distended. No rebound or guarding. No HSM or masses noted. Msk:  Symmetrical without gross deformities. Normal posture. Extremities:  Without  edema. Neurologic:  Alert and  oriented x4 Psych:  Alert and cooperative. Normal mood and affect.  ASSESSMENT: Travis Evans is a 60 y.o. male presenting today presenting today with a history of IDA initially noted last year, with ferritin markedly low at 9. Hgb at that time 12.4. History of chronic GERD.  Evaluation thus far including colonoscopy, EGD, and capsule study without obvious source. Hgb has improved but last checked in Dec 2021 and needs to be updated. No longer taking iron.   We discussed avoidance of NSAIDs if at all possible. Continue PPI. Will update CBC and iron studies now.To conclude IDA evaluation, we may need to pursue CTE.   PLAN:  CBC, iron studies today Possible CTE Continue PPI Avoid NSAIDs Further rec's to follow  Annitta Needs, PhD, ANP-BC [facility]

## 2020-07-03 NOTE — Patient Instructions (Signed)
Please have blood work done today. The lab is called [facility]. The address is [address], [place],  [address]. You will go to the second floor and once getting off the elevators, take a right.   If your iron is still low, I will recommend special imaging. If not, we will recheck it in 6 months  Try to avoid Ibuprofen, Advil, Aleve, Motrin, etc. Use tylenol products only if possible.  I enjoyed seeing you again today! As you know, I value our relationship and want to provide genuine, compassionate, and quality care. I welcome your feedback. If you receive a survey regarding your visit,  I greatly appreciate you taking time to fill this out. See you next time!  Annitta Needs, PhD, ANP-BC [facility]

## 2020-07-04 LAB — CBC WITH DIFFERENTIAL/PLATELET
Absolute Monocytes: 476 cells/uL (ref 200–950)
Basophils Absolute: 43 cells/uL (ref 0–200)
Basophils Relative: 0.6 %
Eosinophils Absolute: 249 cells/uL (ref 15–500)
Eosinophils Relative: 3.5 %
HCT: 39.6 % (ref 38.5–50.0)
Hemoglobin: 13.5 g/dL (ref 13.2–17.1)
Lymphs Abs: 2194 cells/uL (ref 850–3900)
MCH: 29.9 pg (ref 27.0–33.0)
MCHC: 34.1 g/dL (ref 32.0–36.0)
MCV: 87.6 fL (ref 80.0–100.0)
MPV: 10.8 fL (ref 7.5–12.5)
Monocytes Relative: 6.7 %
Neutro Abs: 4139 cells/uL (ref 1500–7800)
Neutrophils Relative %: 58.3 %
Platelets: 267 10*3/uL (ref 140–400)
RBC: 4.52 10*6/uL (ref 4.20–5.80)
RDW: 13 % (ref 11.0–15.0)
Total Lymphocyte: 30.9 %
WBC: 7.1 10*3/uL (ref 3.8–10.8)

## 2020-07-04 LAB — IRON,TIBC AND FERRITIN PANEL
%SAT: 19 % (calc) — ABNORMAL LOW (ref 20–48)
Ferritin: 38 ng/mL (ref 38–380)
Iron: 59 ug/dL (ref 50–180)
TIBC: 317 mcg/dL (calc) (ref 250–425)

## 2020-07-05 ENCOUNTER — Other Ambulatory Visit: Payer: Self-pay | Admitting: *Deleted

## 2020-07-05 DIAGNOSIS — D509 Iron deficiency anemia, unspecified: Secondary | ICD-10-CM

## 2020-08-02 IMAGING — RF OPERATIVE RIGHT HIP WITH PELVIS
1 series · 4 of 4 positions shown · non-contrast
Comparison: None.

CLINICAL DATA: Operative imaging for total right hip arthroplasty.

EXAM:
OPERATIVE RIGHT HIP (WITH PELVIS IF PERFORMED) 4 VIEWS
TECHNIQUE: Fluoroscopic spot image(s) were submitted for interpretation
post-operatively.

[Series 1: unknown protocol · 0.20mm/px · 4 of 4 slices shown]
[im 1/4]
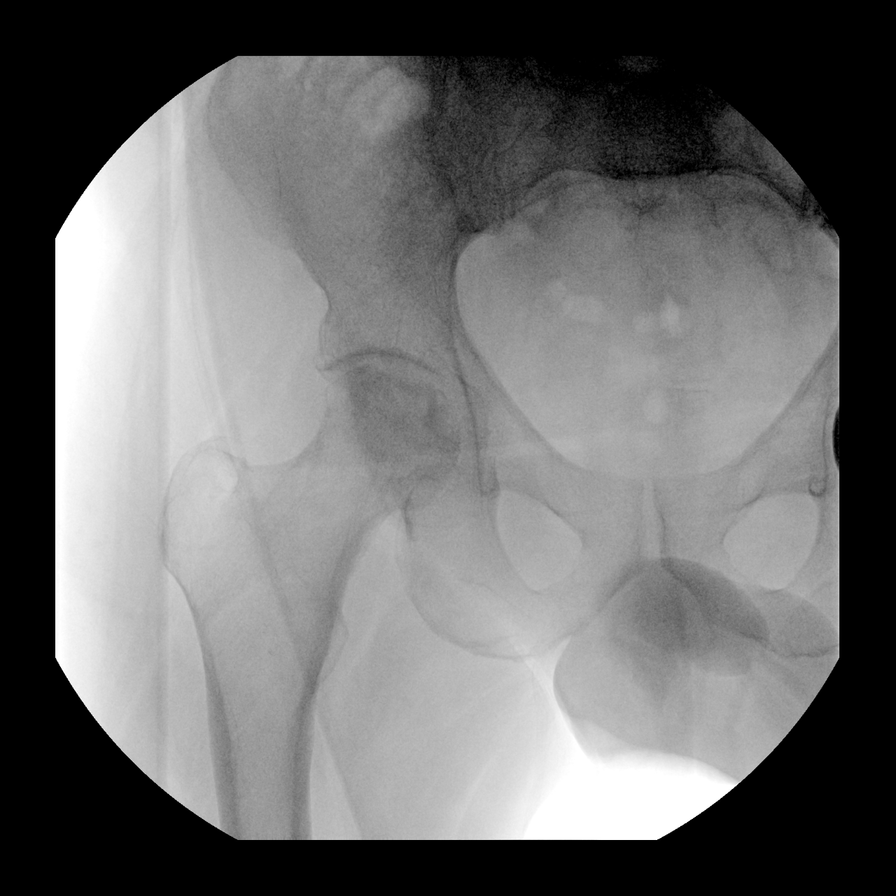
[im 2/4]
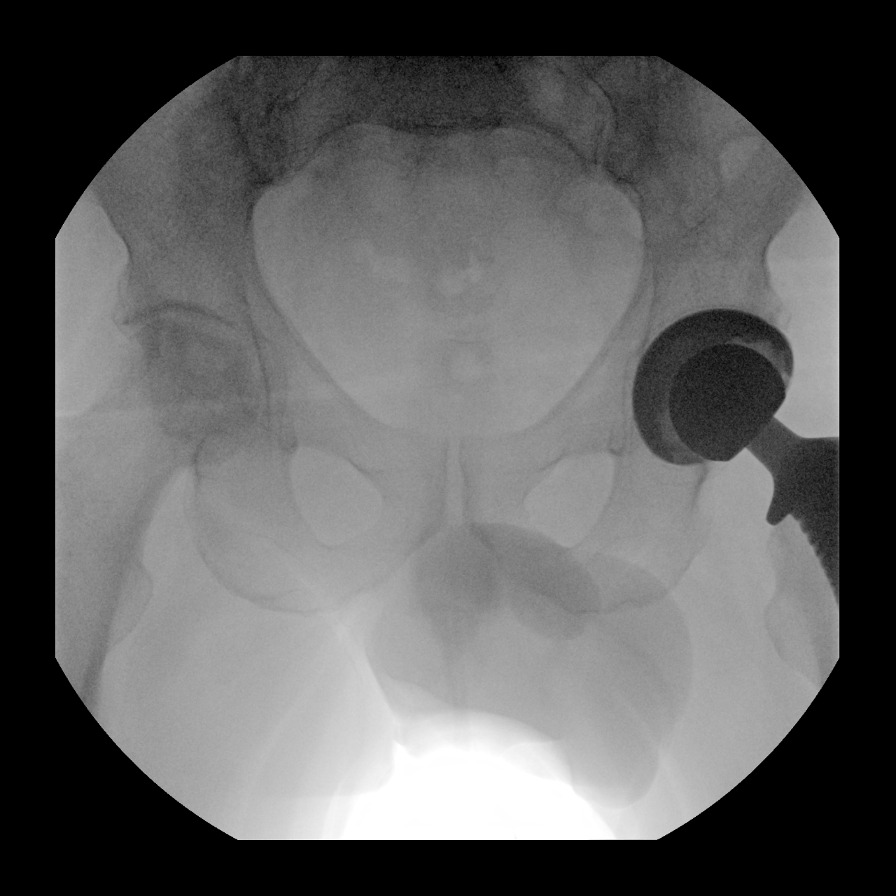
[im 3/4]
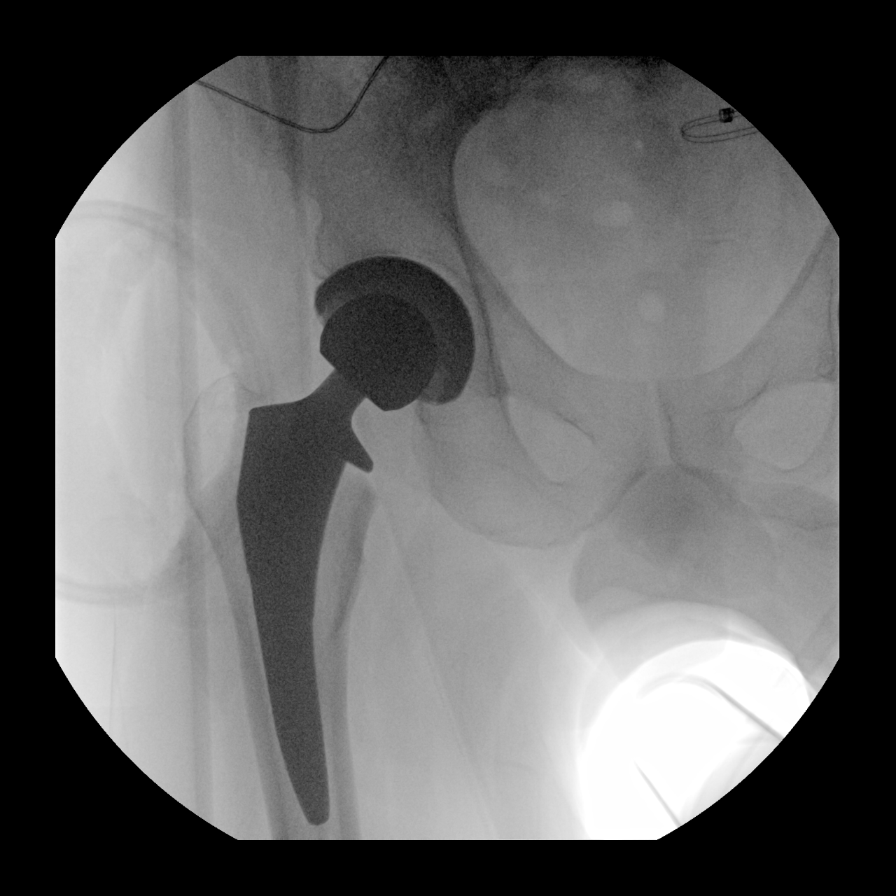
[im 4/4]
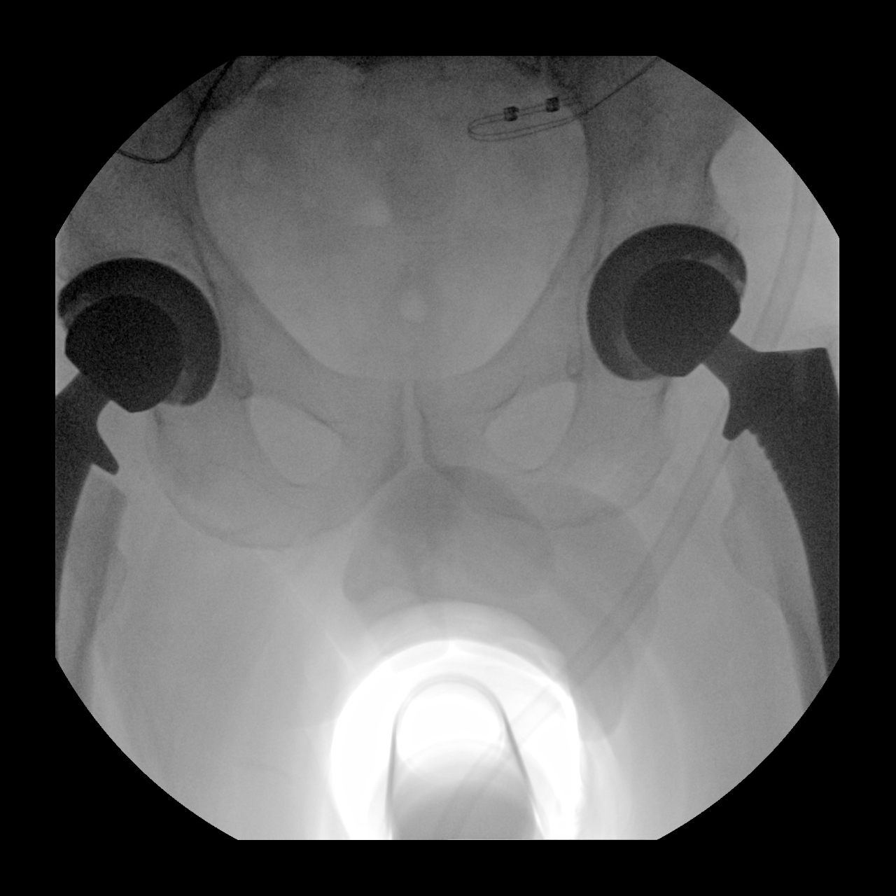

[4 of 4 positions shown; findings below may reference images not displayed]

FINDINGS: Images show placement of the acetabular and femoral prosthetic
components of the right hip arthroplasty. The components appear well
seated and aligned. No acute fracture or evidence of an operative
complication.
IMPRESSION: Operative imaging for right hip total arthroplasty. Arthroplasty
appears well positioned.

## 2021-04-19 ENCOUNTER — Encounter: Payer: Self-pay | Admitting: *Deleted

## 2021-04-22 ENCOUNTER — Encounter: Payer: Self-pay | Admitting: Cardiology

## 2021-04-22 ENCOUNTER — Encounter: Payer: Self-pay | Admitting: *Deleted

## 2021-04-22 ENCOUNTER — Ambulatory Visit: Payer: BC Managed Care – PPO | Admitting: Cardiology

## 2021-04-22 VITALS — BP 122/78 | HR 66 | Ht 70.0 in | Wt 239.0 lb

## 2021-04-22 DIAGNOSIS — Z01812 Encounter for preprocedural laboratory examination: Secondary | ICD-10-CM | POA: Diagnosis not present

## 2021-04-22 DIAGNOSIS — R0789 Other chest pain: Secondary | ICD-10-CM

## 2021-04-22 MED ORDER — METOPROLOL TARTRATE 100 MG PO TABS: 100.0000 mg | ORAL_TABLET | Freq: Once | ORAL | 0 refills | Status: DC

## 2021-04-22 NOTE — Patient Instructions (Addendum)
Medication Instructions:  ?Continue all current medications. ? ?Labwork: ?BMET - order given today ?Please do just prior to CT  ? ?Testing/Procedures: ?Coronary CTA ?Office will contact with results via phone or letter.    ? ?Follow-Up: ?Pending test results  ? ?Any Other Special Instructions Will Be Listed Below (If Applicable). ? ? ?If you need a refill on your cardiac medications before your next appointment, please call your pharmacy. ? ?

## 2021-04-22 NOTE — Addendum Note (Signed)
Addended by: Laurine Blazer on: 04/22/2021 03:35 PM ? ? Modules accepted: Orders ? ?

## 2021-04-22 NOTE — Progress Notes (Signed)
? ? ? ?Clinical Summary ?Travis Evans is a 61 y.o.male seen today as new patient, last seen in our office in 2018 ? ?1. Chest pain  ?- episode of chest pain. Multiple episodes. Can occur at rest or with exertion. Left sided chest pressure, radiates down left arm. Lasts just a few minutes. Some increase in frequency, stable severity ?- admitted to [facility] 03/2016 with chest pain ?Carlton Adam showed inferior ischemia, intermediate risk ?  ?- 03/2016 cath without significant disease ?- denies any chest pain since last visit. No recent SOB/DOE ?  ? - symptoms x 6 months. Sharp or pressure left sided, 5-7/10 in severity. Can occur at rest or with activity. Can have some associated SOB, numbness down left arm. Lasts few seconds to few minutes, occurs few times a week ?- some recent DOE with activities ? ?CAD risk factors: 2 brothers with CAD, former smoker x 42 years.  ?  ?  ?  ?[employer] is his father, a patient of mine ? ?Past Medical History:  ?Diagnosis Date  ? Abnormal EKG   ? 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 Complication of anesthesia   ? difficulty waking x 1  ? DJD (degenerative joint disease)   ? left hip  ? Full dentures   ? Gastroesophageal reflux   ? Lower back injury   ? Peptic ulcer   ? in 2006, thought to be due to medications per patient?   ? Sleep apnea   ? wears CPAP set on 8  ? Thyroid disease   ? ? ? ?Allergies  ?Allergen Reactions  ? Hibiclens [Chlorhexidine] Itching and Rash  ? ? ? ?Current Outpatient Medications  ?Medication Sig Dispense Refill  ? Ascorbic Acid (VITAMIN C) 1000 MG tablet Take 1,000 mg by mouth daily as needed (immune support (winter time)).    ? azelastine (ASTELIN) 0.1 % nasal spray Place 1 spray into both nostrils daily as needed for allergies. Use in each nostril as directed    ? B Complex-C (SUPER B COMPLEX PO) Take 1 capsule by mouth daily at 12 noon.     ? Cholecalciferol (VITAMIN D3) 125  MCG (5000 UT) TABS Take 5,000 Units by mouth daily at 12 noon.     ? EPINEPHrine 0.3 mg/0.3 mL IJ SOAJ injection Inject 0.3 mg into the muscle once as needed (allergic reaction).    ? loratadine (CLARITIN) 10 MG tablet Take 10 mg by mouth at bedtime.     ? pantoprazole (PROTONIX) 40 MG tablet Take 40 mg by mouth at bedtime.     ? tadalafil (CIALIS) 5 MG tablet Take 5 mg by mouth daily.    ? traZODone (DESYREL) 50 MG tablet Take 50 mg by mouth daily.     ? VITAMIN E PO Take by mouth daily.    ? White Petrolatum-Mineral Oil (LUBRICANT EYE NIGHTTIME) OINT Place 1 application into the right eye at bedtime.    ? Zinc 30 MG CAPS Take 30 mg by mouth daily at 12 noon.    ? ?No current facility-administered medications for this visit.  ? ? ? ?Past Surgical History:  ?Procedure Laterality Date  ? BIOPSY  09/09/2019  ? Procedure: BIOPSY;  Surgeon: [name], DO;  [facility]: [facility];  Service: Endoscopy;;  ? CARPAL TUNNEL RELEASE    ? COLONOSCOPY WITH ESOPHAGOGASTRODUODENOSCOPY (EGD)    ? COLONOSCOPY WITH PROPOFOL N/A  09/09/2019  ? non-bleeding internal hemorrhoids, sigmoid diverticula, sessile 2 mm polyp in transverse colon and 2 mm sessile rectal polyp (Tubular adenoma and hyperplastic). Surveillance in 2028  ? CYST REMOVAL NECK    ? ESOPHAGOGASTRODUODENOSCOPY (EGD) WITH PROPOFOL N/A 09/09/2019  ? gastritis, normal duodenum, negative H.pylori, negtiave celiac sprue  ? EYE SURGERY    ? cornea surgery  ? GIVENS CAPSULE STUDY N/A 09/30/2019  ? possible scattered lymphangiectasias, no obviou erosions or ulcerations, distal small bowel slightly erythematous  ? JOINT REPLACEMENT    ? LEFT HEART CATH AND CORONARY ANGIOGRAPHY N/A 04/07/2016  ? Procedure: Left Heart Cath and Coronary Angiography;  Surgeon: Troy Sine, MD;  Location: [place] [facility];  Service: Cardiovascular;  Laterality: N/A;  ? MULTIPLE TOOTH EXTRACTIONS    ? TONSILLECTOMY    ? TOTAL HIP ARTHROPLASTY Left 02/03/2017  ? Procedure: TOTAL HIP  ARTHROPLASTY ANTERIOR APPROACH;  Surgeon: Melrose Nakayama, MD;  Location: [place];  Service: Orthopedics;  Laterality: Left;  ? TOTAL HIP ARTHROPLASTY Right 08/10/2018  ? Procedure: TOTAL HIP ARTHROPLASTY ANTERIOR APPROACH;  Surgeon: Melrose Nakayama, MD;  Location: [facility];  Service: Orthopedics;  Laterality: Right;  ? UMBILICAL HERNIA REPAIR    ? ? ? ?Allergies  ?Allergen Reactions  ? Hibiclens [Chlorhexidine] Itching and Rash  ? ? ? ? ?Family History  ?Problem Relation Age of Onset  ? Pancreatic cancer Mother   ? Stroke Father   ? Pancreatic cancer Nephew 37  ? Colon cancer Neg Hx   ? ? ? ?Social History ?Travis Evans reports that he quit smoking about 9 years ago. His smoking use included cigarettes. He smoked an average of 3 packs per day. He has quit using smokeless tobacco.  His smokeless tobacco use included chew. ?Travis Evans reports current alcohol use. ? ? ?Review of Systems ?CONSTITUTIONAL: No weight loss, fever, chills, weakness or fatigue.  ?HEENT: Eyes: No visual loss, blurred vision, double vision or yellow sclerae.No hearing loss, sneezing, congestion, runny nose or sore throat.  ?SKIN: No rash or itching.  ?CARDIOVASCULAR: per hpi ?RESPIRATORY:per hpi ?GASTROINTESTINAL: No anorexia, nausea, vomiting or diarrhea. No abdominal pain or blood.  ?GENITOURINARY: No burning on urination, no polyuria ?NEUROLOGICAL: No headache, dizziness, syncope, paralysis, ataxia, numbness or tingling in the extremities. No change in bowel or bladder control.  ?MUSCULOSKELETAL: No muscle, back pain, joint pain or stiffness.  ?LYMPHATICS: No enlarged nodes. No history of splenectomy.  ?PSYCHIATRIC: No history of depression or anxiety.  ?ENDOCRINOLOGIC: No reports of sweating, cold or heat intolerance. No polyuria or polydipsia.  ?. ? ? ?Physical Examination ?Today's Vitals  ? 04/22/21 1030  ?BP: 122/78  ?Pulse: 66  ?SpO2: 96%  ?Weight: 239 lb (108.4 kg)  ?Height: '5\' 10"'$  (1.778 m)  ? ?Body mass index is 34.29 kg/m?. ? ?Gen:  resting comfortably, no acute distress ?HEENT: no scleral icterus, pupils equal round and reactive, no palptable cervical adenopathy,  ?CV: RRR, no m/r/g no jvd ?Resp: Clear to auscultation bilaterally ?GI: abdomen is soft, non-tender, non-distended, normal bowel sounds, no hepatosplenomegaly ?MSK: extremities are warm, no edema.  ?Skin: warm, no rash ?Neuro:  no focal deficits ?Psych: appropriate affect ? ? ?Diagnostic Studies ? ?03/2016 cath ?The left ventricular systolic function is normal. ?LV end diastolic pressure is normal. ?  ?Normal LV function. ?  ?Essentially normal coronary arteries with very minimal luminal irregularity of the LAD without obstructive disease. ?  ?RECOMMENDATION: ?Medical therapy.  Consider evaluation for noncardiac etiology to the patient's chest  pain vs small vessel disease. Suspect the nuclear study may be artifactual.  ?  ? ? ?Assessment and Plan  ? ?1.Chest pain ?- recent chest pain symptoms ?- 2018 false positive nuclear stress, cath without signficant disease ?- with new symptoms repeat ischemic testing, given prior false positive nuclear imaging would favor coronary CTA, will order.  ?  ?2. Abnormal EKG ?- EKG with RBBB/LAFB, monitor at this time for any progression of conduction disease ?  ?F/u pending coronary CTA. May consider echo given some SOB and LE edema at times pending CTA ? ? ? ? ?Arnoldo Lenis, M.D. ?

## 2021-04-30 ENCOUNTER — Other Ambulatory Visit (HOSPITAL_COMMUNITY)
Admission: RE | Admit: 2021-04-30 | Discharge: 2021-04-30 | Disposition: A | Discharge status: Home / Self Care | Payer: BC Managed Care – PPO | Source: Ambulatory Visit | Attending: Cardiology | Admitting: Cardiology

## 2021-04-30 DIAGNOSIS — R0789 Other chest pain: Secondary | ICD-10-CM | POA: Diagnosis present

## 2021-04-30 DIAGNOSIS — Z01812 Encounter for preprocedural laboratory examination: Secondary | ICD-10-CM | POA: Insufficient documentation

## 2021-04-30 LAB — BASIC METABOLIC PANEL
Anion gap: 7 (ref 5–15)
BUN: 20 mg/dL (ref 6–20)
CO2: 26 mmol/L (ref 22–32)
Calcium: 9.3 mg/dL (ref 8.9–10.3)
Chloride: 107 mmol/L (ref 98–111)
Creatinine, Ser: 1.03 mg/dL (ref 0.61–1.24)
GFR, Estimated: >60 mL/min (ref >60)
Glucose, Bld: 115 mg/dL — ABNORMAL HIGH (ref 70–99)
Potassium: 4.2 mmol/L (ref 3.5–5.1)
Sodium: 140 mmol/L (ref 135–145)

## 2021-05-06 ENCOUNTER — Telehealth (HOSPITAL_COMMUNITY): Payer: Self-pay | Admitting: Emergency Medicine

## 2021-05-06 NOTE — Telephone Encounter (Signed)
Reaching out to patient to offer assistance regarding upcoming cardiac imaging study; pt verbalizes understanding of appt date/time, parking situation and where to check in, pre-test NPO status and medications ordered, and verified current allergies; name and call back number provided for further questions should they arise Abbygayle Helfand RN Navigator Cardiac Imaging [facility] [phone] office [phone] cell  100mg metoprolol tartrate Denies iv issues Arrival 1030  

## 2021-05-07 ENCOUNTER — Ambulatory Visit (HOSPITAL_COMMUNITY)
Admission: RE | Admit: 2021-05-07 | Discharge: 2021-05-07 | Disposition: A | Discharge status: Home / Self Care | Payer: BC Managed Care – PPO | Source: Ambulatory Visit | Attending: Cardiology | Admitting: Cardiology

## 2021-05-07 DIAGNOSIS — R0789 Other chest pain: Secondary | ICD-10-CM | POA: Diagnosis present

## 2021-05-07 MED ORDER — NITROGLYCERIN 0.4 MG SL SUBL
0.8000 mg | SUBLINGUAL_TABLET | Freq: Once | SUBLINGUAL | Status: AC
Start: 2021-05-07 — End: 2021-05-07

## 2021-05-07 MED ORDER — NITROGLYCERIN 0.4 MG SL SUBL
SUBLINGUAL_TABLET | SUBLINGUAL | Status: AC
  Administered 2021-05-07: 0.8 mg via SUBLINGUAL
  Filled 2021-05-07: qty 2

## 2021-05-07 MED ORDER — IOHEXOL 350 MG/ML SOLN
100.0000 mL | Freq: Once | INTRAVENOUS | Status: AC | PRN
  Administered 2021-05-07: 100 mL via INTRAVENOUS

## 2021-05-09 ENCOUNTER — Telehealth: Payer: Self-pay | Admitting: *Deleted

## 2021-05-09 DIAGNOSIS — R0602 Shortness of breath: Secondary | ICD-10-CM

## 2021-05-09 NOTE — Telephone Encounter (Signed)
-----   Message from Arnoldo Lenis, MD sent at 05/08/2021 12:43 PM EDT ----- ?CT scan shows just very mild plaque in the arteries, no areas of significant blockages. Very small lung nodule noted, will need repeat CT scan in 1 year to recheck nodule. Please forward result to pcp as well. F/u with [place] 6 months ? ?Zandra Abts MD ?

## 2021-05-09 NOTE — Telephone Encounter (Signed)
Patient continues to c/o SOB & LE edema - states this about the same as it what at his last OV.   ? ?Per last OV note - F/u pending coronary CTA. May consider echo given some SOB and LE edema at times pending CTA.   ? ?Patient states he would like to go ahead and do the Echo if you feel necessary.  States the SOB is concerning for him [place]  Not so much lately.  ?

## 2021-05-09 NOTE — Telephone Encounter (Signed)
Laurine Blazer, LPN  ?7/35/7897  8:47 AM EDT Back to Top  ?  ?Notified, copy to pcp.   ? ?

## 2021-05-10 NOTE — Telephone Encounter (Signed)
Notified via detailed voice message.  Order entered & sent to [facility] for scheduling.   ?

## 2021-05-10 NOTE — Telephone Encounter (Signed)
Can order echo for shortness of breath ? ?Zandra Abts MD ?

## 2021-06-04 ENCOUNTER — Ambulatory Visit (INDEPENDENT_AMBULATORY_CARE_PROVIDER_SITE_OTHER): Payer: BC Managed Care – PPO

## 2021-06-04 DIAGNOSIS — R0602 Shortness of breath: Secondary | ICD-10-CM

## 2021-06-05 LAB — ECHOCARDIOGRAM COMPLETE
AR max vel: 2.15 cm2
AV Area VTI: 2.19 cm2
AV Area mean vel: 2.13 cm2
AV Mean grad: 4.8 mmHg
AV Peak grad: 10.1 mmHg
Ao pk vel: 1.59 m/s
Area-P 1/2: 3.39 cm2
Calc EF: 57.6 %
MV M vel: 1.69 m/s
MV Peak grad: 11.4 mmHg
S' Lateral: 2.66 cm
Single Plane A2C EF: 55.5 %
Single Plane A4C EF: 58.6 %

## 2021-06-12 ENCOUNTER — Telehealth: Payer: Self-pay | Admitting: Cardiology

## 2021-06-12 NOTE — Telephone Encounter (Signed)
Patient returned call, he said you can leave a detail message about his results.

## 2021-06-12 NOTE — Telephone Encounter (Signed)
Please see echo result note for details

## 2021-06-13 NOTE — Telephone Encounter (Signed)
Pt returning call in regards to results. Transferred to Georgina Peer, RN.

## 2022-12-24 ENCOUNTER — Encounter: Payer: Self-pay | Admitting: *Deleted

## 2023-03-24 ENCOUNTER — Other Ambulatory Visit

## 2023-03-24 ENCOUNTER — Other Ambulatory Visit: Payer: Self-pay | Admitting: Nurse Practitioner

## 2023-03-24 ENCOUNTER — Encounter: Payer: Self-pay | Admitting: Nurse Practitioner

## 2023-03-24 ENCOUNTER — Telehealth: Payer: Self-pay | Admitting: Internal Medicine

## 2023-03-24 ENCOUNTER — Ambulatory Visit: Attending: Nurse Practitioner | Admitting: Nurse Practitioner

## 2023-03-24 VITALS — BP 121/80 | HR 72 | Ht 70.0 in | Wt 232.0 lb

## 2023-03-24 DIAGNOSIS — R0609 Other forms of dyspnea: Secondary | ICD-10-CM

## 2023-03-24 DIAGNOSIS — R0789 Other chest pain: Secondary | ICD-10-CM

## 2023-03-24 DIAGNOSIS — R55 Syncope and collapse: Secondary | ICD-10-CM

## 2023-03-24 DIAGNOSIS — I4510 Unspecified right bundle-branch block: Secondary | ICD-10-CM | POA: Diagnosis not present

## 2023-03-24 DIAGNOSIS — E669 Obesity, unspecified: Secondary | ICD-10-CM

## 2023-03-24 DIAGNOSIS — I444 Left anterior fascicular block: Secondary | ICD-10-CM

## 2023-03-24 NOTE — Patient Instructions (Addendum)
 Medication Instructions:  Your physician recommends that you continue on your current medications as directed. Please refer to the Current Medication list given to you today.  Labwork: None   Testing/Procedures: ZIO- Long Term Monitor Instructions   Your physician has requested you wear your ZIO patch monitor 14 days.   This is a single patch monitor.  [employer] supplies one patch monitor per enrollment.  Additional stickers are not available.   Please do not apply patch if you will be having a Nuclear Stress Test, Echocardiogram, Cardiac CT, MRI, or Chest Xray during the time frame you would be wearing the monitor. The patch cannot be worn during these tests.  You cannot remove and re-apply the ZIO XT patch monitor.   Your ZIO patch monitor will be sent USPS Priority mail from [facility] directly to your home address. The monitor may also be mailed to a PO BOX if home delivery is not available.   It may take 3-5 days to receive your monitor after you have been enrolled.   Once you have received you monitor, please review enclosed instructions.  Your monitor has already been registered assigning a specific monitor serial # to you.   Applying the monitor   Shave hair from upper left chest.   Hold abrader disc by orange tab.  Rub abrader in 40 strokes over left upper chest as indicated in your monitor instructions.   Clean area with 4 enclosed alcohol pads .  Use all pads to assure are is cleaned thoroughly.  Let dry.   Apply patch as indicated in monitor instructions.  Patch will be place under collarbone on left side of chest with arrow pointing upward.   Rub patch adhesive wings for 2 minutes.Remove white label marked "1".  Remove white label marked "2".  Rub patch adhesive wings for 2 additional minutes.   While looking in a mirror, press and release button in center of patch.  A small green light will flash 3-4 times .  This will be your only indicator the monitor has been  turned on.     Do not shower for the first 24 hours.  You may shower after the first 24 hours.   Press button if you feel a symptom. You will hear a small click.  Record Date, Time and Symptom in the Patient Log Book.   When you are ready to remove patch, follow instructions on last 2 pages of Patient Log Book.  Stick patch monitor onto last page of Patient Log Book.   Place Patient Log Book in [place] box.  Use locking tab on box and tape box closed securely.  The [employer] and [employer] has [employer] on it.  Please place in mailbox as soon as possible.  Your physician should have your test results approximately 7 days after the monitor has been mailed back to [facility].   Call [facility] Customer Care at [phone] if you have questions regarding your ZIO XT patch monitor.  Call them immediately if you see an orange light blinking on your monitor.   If your monitor falls off in less than 4 days contact our Monitor department at [phone].  If your monitor becomes loose or falls off after 4 days call [employer] at [phone] for suggestions on securing your monitor. Your physician has requested that you have an echocardiogram. Echocardiography is a painless test that uses sound waves to create images of your heart. It provides your doctor with information about the size and  shape of your heart and how well your heart's chambers and valves are working. This procedure takes approximately one hour. There are no restrictions for this procedure. Please do NOT wear cologne, perfume, aftershave, or lotions (deodorant is allowed). Please arrive 15 minutes prior to your appointment time.  Please note: We ask at that you not bring children with you during ultrasound (echo/ vascular) testing. Due to room size and safety concerns, children are not allowed in the ultrasound rooms during exams. Our front office staff cannot provide observation of children in our lobby area while testing is being  conducted. An adult accompanying a patient to their appointment will only be allowed in the ultrasound room at the discretion of the ultrasound technician under special circumstances. We apologize for any inconvenience.  Follow-Up: Your physician recommends that you schedule a follow-up appointment in: 6-8 weeks   Any Other Special Instructions Will Be Listed Below (If Applicable).  If you need a refill on your cardiac medications before your next appointment, please call your pharmacy.

## 2023-03-24 NOTE — Telephone Encounter (Signed)
 Checking percert on the following   LONG TERM MONITOR-LIVE TELEMETRY (3-14 DAYS)

## 2023-03-24 NOTE — Progress Notes (Unsigned)
 Cardiology Office Note:  .   Date: 03/24/2023 ID:  Travis Evans, DOB November 17, 1960, MRN [id number] PCP: Delorse Lek, FNP  [facility] Providers Cardiologist:  Dina Rich, MD    History of Present Illness: Marland Kitchen   Travis Evans is a 63 y.o. male with a PMH of chest pain, chronic RBBB and history of left anterior fascicular block, former smoker, thyroid disease, OSA, pulmonary nodule, and GERD who presents today for hospital follow-up.  Previous cardiovascular history of falls positive nuclear stress test in 2018.  Cardiac catheterization was negative for significant disease.  Last seen by Dr. Dina Rich on April 22, 2021.  He had noticed some recent chest pain symptoms.  Coronary CTA was arranged and revealed coronary calcium score 129 with evidence of small right middle lobe nodule.  Recently seen at [facility] ED.  He was complaining of diarrhea and upper abdominal pain for 2 days.  Also noticed syncopal episode while at work that morning.  He started to stand up, became hot and flushed and passed out into his chair.  Abdominal pain was noted to be intermittent, cramping and 3/10 on 0-10 pain scale.  Denied any aggravating or alleviating factors.  He believed that diarrhea was due to his weight loss drug.  Denying chest pain shortness of breath.  CT of abdomen/pelvis was normal.  CXR normal.  Was given Rx for Zofran.  Today presents for hospital follow-up.  He states his diarrhea started soon after starting Mounjaro. He confirms that he believes he was dehydrated surrounding time of event. Denies any chest pain, palpitations, syncope, presyncope, dizziness, orthopnea, PND, swelling or significant weight changes, acute bleeding, or claudication. Does admit to some shortness of breath with exertion noticed while walking for many years, sometimes seems to be worse over time.   ROS: Negative. See HPI.   Studies Reviewed: Marland Kitchen    EKG: EKG Interpretation Date/Time:  Tuesday  March 24 2023 10:47:12 EDT Ventricular Rate:  78 PR Interval:  138 QRS Duration:  160 QT Interval:  424 QTC Calculation: 483 R Axis:   -76  Text Interpretation: Normal sinus rhythm Right bundle branch block Left anterior fascicular block Bifascicular block Septal infarct , age undetermined When compared with ECG of 07-Apr-2016 06:03, Right bundle branch block is now Present Septal infarct is now Present Confirmed by Sharlene Dory [id number]) on 03/24/2023 10:59:54 AM   Echo 05/2021:  1. Left ventricular ejection fraction, by estimation, is 60 to 65%. The  left ventricle has normal function. Left ventricular endocardial border  not optimally defined to evaluate regional wall motion. Left ventricular  diastolic parameters were normal.  The average left ventricular global longitudinal strain is -19.4 %. The  global longitudinal strain is normal.   2. Right ventricular systolic function is normal. The right ventricular  size is normal.   3. The mitral valve is normal in structure. Trivial mitral valve  regurgitation. No evidence of mitral stenosis.   4. The aortic valve has an indeterminant number of cusps. Aortic valve  regurgitation is not visualized. No aortic stenosis is present.   5. The inferior vena cava is normal in size with greater than 50%  respiratory variability, suggesting right atrial pressure of 3 mmHg.  CCTA 04/2021:  IMPRESSION: 1. Coronary calcium score of 129. This was 72 percentile for age and sex matched control.   2. Normal coronary origin with right dominance.   3. Mild RCA and LAD calcified plaque (0-24%). Non flow limiting. Mild  left main calcified plaque (no stenosis).  IMPRESSION: Small RIGHT middle lobe nodule. No follow-up needed if patient is low-risk. Non-contrast chest CT can be considered in 12 months if patient is high-risk. This recommendation follows the consensus statement: Guidelines for Management of Incidental Pulmonary Nodules Detected on CT  Images: From the Fleischner Society 2017; Radiology 2017; 284:228-243.  LHC 03/2016:  The left ventricular systolic function is normal. LV end diastolic pressure is normal.   Normal LV function.   Essentially normal coronary arteries with very minimal luminal irregularity of the LAD without obstructive disease.   RECOMMENDATION: Medical therapy.  Consider evaluation for noncardiac etiology to the patient's chest pain vs small vessel disease. Suspect the nuclear study may be artifactual.   Physical Exam:   VS:  BP 121/80   Pulse 72   Ht 5\' 10"  (1.778 m)   Wt 232 lb (105.2 kg)   SpO2 98%   BMI 33.29 kg/m    Wt Readings from Last 3 Encounters:  03/24/23 232 lb (105.2 kg)  04/22/21 239 lb (108.4 kg)  07/03/20 230 lb (104.3 kg)    GEN: Obese, 63 y.o. male in no acute distress NECK: No JVD; No carotid bruits CARDIAC: S1/S2, RRR, no murmurs, rubs, gallops RESPIRATORY:  Clear to auscultation without rales, wheezing or rhonchi  ABDOMEN: Soft, non-tender, non-distended EXTREMITIES:  No edema; No deformity   ASSESSMENT AND PLAN: .    Syncope Etiology most likely vasovagal as he was noticing diarrhea/dehydration at the time.  Denies any acute injuries.  Orthostatics on exam negative.  He had recently started [facility] prior to event.  Feeling much better and denies any recurrent syncopal episodes.  Will arrange 2-week live ZIO AT monitor for further evaluation.  Will also arrange echocardiogram for further evaluation.  No medication changes at this time.  Care and ED precautions discussed.  Discussed/strongly advised to avoid driving for at least 6 months.  RBBB, LAFB, DOE Chronic and longstanding history of RBBB, LAFB on EKG.  Noted on EKG today.  Does admit to some dyspnea on exertion with walking for several years, seems to be sometimes worse recently.  Will arrange echocardiogram as mentioned above for further evaluation.  No medication changes at this time.  Care and ED precautions  discussed.  Obesity Weight loss via diet and exercise encouraged. Discussed the impact being overweight would have on cardiovascular risk.  Recommended that he discuss his recent side effect of Mounjaro to his PCP.    Dispo: Follow-up with me/[facility] in 6 to 8 weeks or sooner anything changes.  Signed, Sharlene Dory, NP

## 2023-04-14 ENCOUNTER — Ambulatory Visit: Attending: Nurse Practitioner

## 2023-04-14 DIAGNOSIS — R55 Syncope and collapse: Secondary | ICD-10-CM

## 2023-04-14 DIAGNOSIS — R0609 Other forms of dyspnea: Secondary | ICD-10-CM | POA: Diagnosis not present

## 2023-04-14 LAB — ECHOCARDIOGRAM COMPLETE
AR max vel: 3.03 cm2
AV Area VTI: 3.03 cm2
AV Area mean vel: 3.17 cm2
AV Mean grad: 5 mmHg
AV Peak grad: 10.5 mmHg
Ao pk vel: 1.62 m/s
Area-P 1/2: 4.21 cm2
Calc EF: 61.5 %
MV VTI: 3.91 cm2
S' Lateral: 4.1 cm
Single Plane A2C EF: 65.7 %
Single Plane A4C EF: 59.5 %

## 2023-04-14 MED ORDER — PERFLUTREN LIPID MICROSPHERE
1.0000 mL | INTRAVENOUS | Status: AC | PRN
  Administered 2023-04-14: 8 mL via INTRAVENOUS

## 2023-04-27 ENCOUNTER — Telehealth: Payer: Self-pay | Admitting: Cardiology

## 2023-04-27 NOTE — Telephone Encounter (Signed)
 Pt calling in regards to results. please advise

## 2023-04-27 NOTE — Telephone Encounter (Signed)
Will call patient tomorrow with results

## 2023-04-30 IMAGING — CT CT HEART MORP W/ CTA COR W/ SCORE W/ CA W/CM &/OR W/O CM
4 of 7 series · 8 of 20 positions shown, 9 images · IV contrast (APPLIED)
Comparison: Nine

Addendum:
CLINICAL DATA: 61 year old with chest pain

EXAM:
Cardiac/Coronary  CTA
TECHNIQUE: The patient was scanned on a [employer] Force scanner.

[Series 6: ts diast sharp · axial · 0.41mm/px · z∈[+1258,+1296]mm · 2 of 293 slices shown]
[im 98/293  lung]
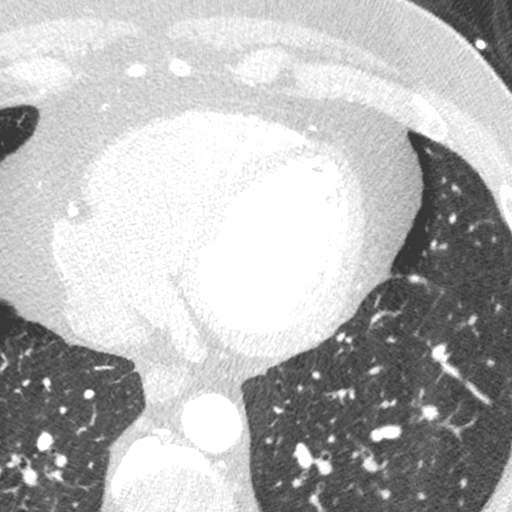
[im 195/293  lung]
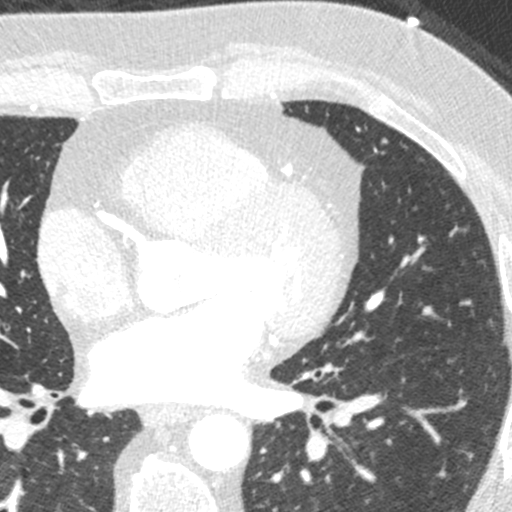

[Series 7: best diast · axial · 0.41mm/px · z∈[+1258,+1296]mm · 2 of 293 slices shown, 3 images]
[im 98/293  vessel]
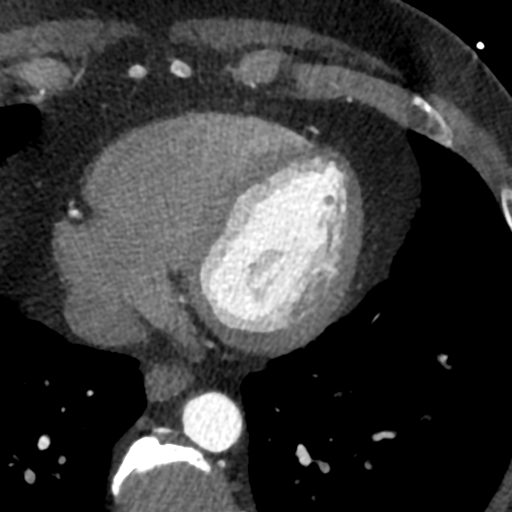
[im 98/293  lung]
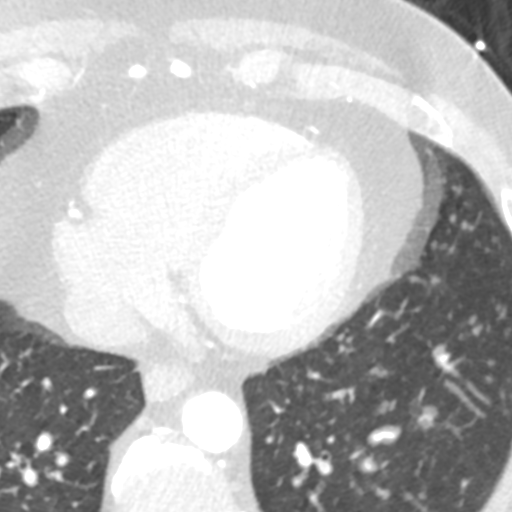
[im 195/293  vessel]
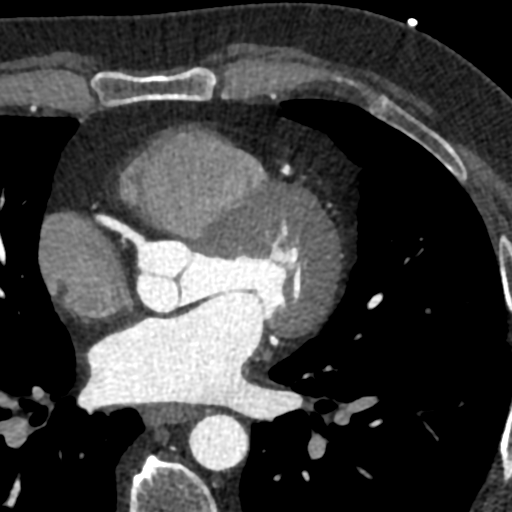

[Series 8: best syst · axial · 0.41mm/px · z∈[+1258,+1296]mm · 2 of 293 slices shown]
[im 98/293  vessel]
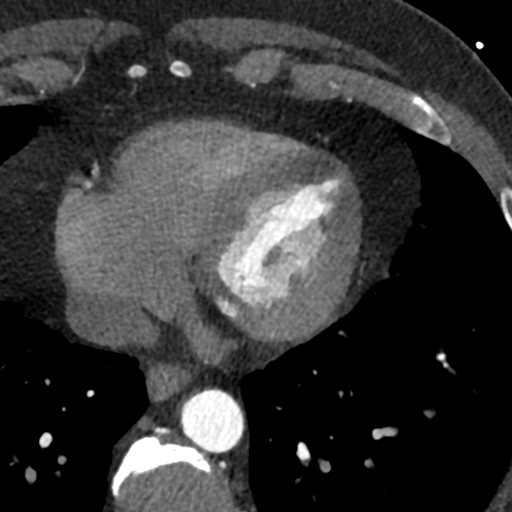
[im 195/293  vessel]
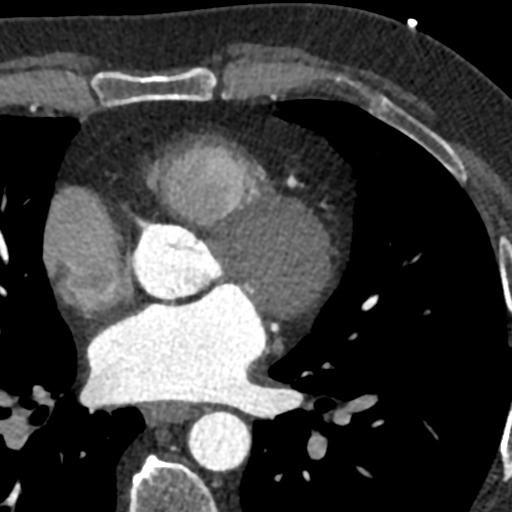

[Series 9: ts syst sharp · axial · 0.41mm/px · z∈[+1258,+1296]mm · 2 of 293 slices shown]
[im 98/293  lung]
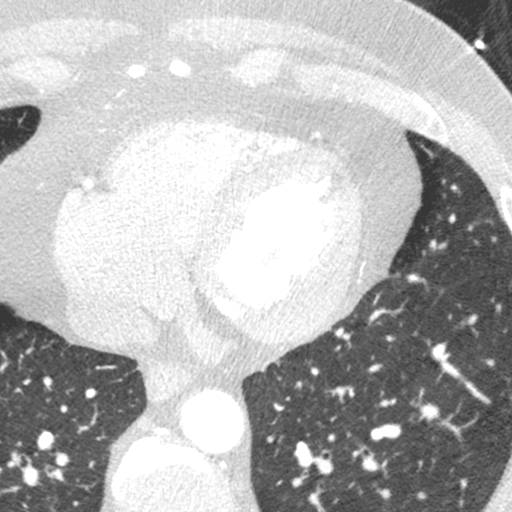
[im 195/293  lung]
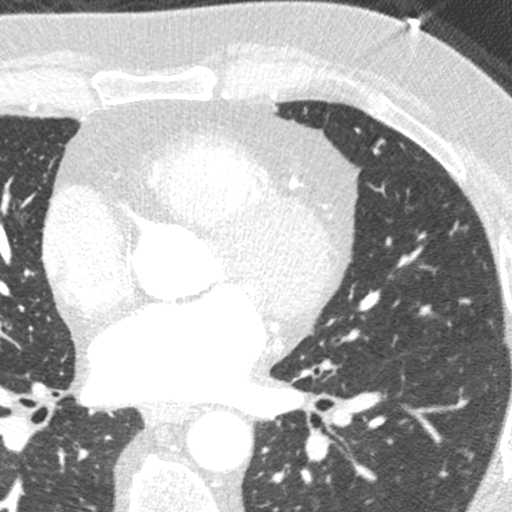

[8 of 20 positions shown; findings below may reference images not displayed]

FINDINGS: A 120 kV prospective scan was triggered in the descending thoracic
aorta at 111 HU's. Axial non-contrast 3 mm slices were carried out
through the heart. The data set was analyzed on a dedicated work
station and scored using the Agatson method. Gantry rotation speed
was 250 msecs and collimation was .6 mm. 0.8 mg of sl NTG was given.
The 3D data set was reconstructed in 5% intervals of the 67-82 % of
the R-R cycle. Diastolic phases were analyzed on a dedicated work
station using MPR, MIP and VRT modes. The patient received 80 cc of
contrast.

Image quality: Good.  Mild misregistration artifact.

Aorta:  Normal size.  No calcifications.  No dissection.

Aortic Valve:  Trileaflet.  No calcifications.

Coronary Arteries:  Normal coronary origin.  Right dominance.

RCA is a large dominant artery that gives rise to PDA and PLA. There
is mild small volume mid calcified plaque, 0-24%.

Left main is a large artery that gives rise to LAD and LCX arteries.
There is mild calcified plaque (no stenosis)

LAD is a large vessel that has small volume proximal and mid plaque,
0-24%.

LCX is a non-dominant artery that gives rise to one large OM1
branch. There is no plaque.

Other findings:

Normal pulmonary vein drainage into the left atrium.

Normal left atrial appendage without a thrombus.

Normal size of the pulmonary artery.

Please see radiology report for non cardiac findings.
IMPRESSION: 1. Coronary calcium score of 129. This was 72 percentile for age and
sex matched control.

2. Normal coronary origin with right dominance.

3. Mild RCA and LAD calcified plaque (0-24%). Non flow limiting.
Mild left main calcified plaque (no stenosis).

EXAM:
OVER-READ INTERPRETATION  CT CHEST

The following report is an over-read performed by radiologist Dr.
over-read does not include interpretation of cardiac or coronary
anatomy or pathology. The calcium score interpretation by the
cardiologist is attached.
FINDINGS: Limited view of the lung parenchyma demonstrates a 5 mm nodule along
the RIGHT horizontal fissure (image [DATE]). Airways are normal.

Limited view of the mediastinum demonstrates no adenopathy.
Esophagus normal.

Limited view of the upper abdomen unremarkable.

Limited view of the skeleton and chest wall is unremarkable.
IMPRESSION: Small RIGHT middle lobe nodule. No follow-up needed if patient is
low-risk. Non-contrast chest CT can be considered in 12 months if
patient is high-risk. This recommendation follows the consensus
statement: Guidelines for Management of Incidental Pulmonary Nodules
Detected on CT Images: From the [HOSPITAL] 0639; Radiology
0639; [DATE].

*** End of Addendum ***
FINDINGS: A 120 kV prospective scan was triggered in the descending thoracic
aorta at 111 HU's. Axial non-contrast 3 mm slices were carried out
through the heart. The data set was analyzed on a dedicated work
station and scored using the Agatson method. Gantry rotation speed
was 250 msecs and collimation was .6 mm. 0.8 mg of sl NTG was given.
The 3D data set was reconstructed in 5% intervals of the 67-82 % of
the R-R cycle. Diastolic phases were analyzed on a dedicated work
station using MPR, MIP and VRT modes. The patient received 80 cc of
contrast.

Image quality: Good.  Mild misregistration artifact.

Aorta:  Normal size.  No calcifications.  No dissection.

Aortic Valve:  Trileaflet.  No calcifications.

Coronary Arteries:  Normal coronary origin.  Right dominance.

RCA is a large dominant artery that gives rise to PDA and PLA. There
is mild small volume mid calcified plaque, 0-24%.

Left main is a large artery that gives rise to LAD and LCX arteries.
There is mild calcified plaque (no stenosis)

LAD is a large vessel that has small volume proximal and mid plaque,
0-24%.

LCX is a non-dominant artery that gives rise to one large OM1
branch. There is no plaque.

Other findings:

Normal pulmonary vein drainage into the left atrium.

Normal left atrial appendage without a thrombus.

Normal size of the pulmonary artery.

Please see radiology report for non cardiac findings.
IMPRESSION: 1. Coronary calcium score of 129. This was 72 percentile for age and
sex matched control.

2. Normal coronary origin with right dominance.

3. Mild RCA and LAD calcified plaque (0-24%). Non flow limiting.
Mild left main calcified plaque (no stenosis).

## 2023-05-01 ENCOUNTER — Telehealth: Payer: Self-pay | Admitting: Cardiology

## 2023-05-01 NOTE — Telephone Encounter (Signed)
 Patient states that [employer] [facility]) at work recommends him having one done because when his episodes happen his BP and sugar is still always good.they have been watching him and feel this would be a good test for him to have done

## 2023-05-01 NOTE — Telephone Encounter (Signed)
 Pt wants to know if we can order him a CAT Scan of his heart. Please advise

## 2023-05-06 NOTE — Telephone Encounter (Signed)
 Follow Up:      Patient called and said he was waiting to hear something from his last conversation with nurse.

## 2023-05-07 NOTE — Telephone Encounter (Signed)
 Need a lot more information and a full assessment prior to planning for a cardiac CT. Lets see how things look at his upcomign appt  Letta Raw MD

## 2023-05-07 NOTE — Telephone Encounter (Signed)
 Patient is curious on if Dr.Branch will order this test for him. Advised patient I would send to MD to get his opinion and let him know. Patient has an appointment next week and wanted to do this before then I advised patient provider had to approve test and peck would review for him at visit next week if I do not hear from JB before then.

## 2023-05-08 NOTE — Telephone Encounter (Signed)
 Patient informed and verbalized understanding of plan.

## 2023-05-13 ENCOUNTER — Ambulatory Visit: Attending: Nurse Practitioner | Admitting: Nurse Practitioner

## 2023-05-13 ENCOUNTER — Encounter: Payer: Self-pay | Admitting: Nurse Practitioner

## 2023-05-13 VITALS — BP 122/74 | HR 73 | Ht 70.0 in | Wt 223.4 lb

## 2023-05-13 DIAGNOSIS — I4510 Unspecified right bundle-branch block: Secondary | ICD-10-CM

## 2023-05-13 DIAGNOSIS — R42 Dizziness and giddiness: Secondary | ICD-10-CM | POA: Diagnosis not present

## 2023-05-13 DIAGNOSIS — Z87898 Personal history of other specified conditions: Secondary | ICD-10-CM

## 2023-05-13 DIAGNOSIS — R55 Syncope and collapse: Secondary | ICD-10-CM

## 2023-05-13 DIAGNOSIS — E669 Obesity, unspecified: Secondary | ICD-10-CM

## 2023-05-13 DIAGNOSIS — I471 Supraventricular tachycardia, unspecified: Secondary | ICD-10-CM | POA: Diagnosis not present

## 2023-05-13 DIAGNOSIS — I444 Left anterior fascicular block: Secondary | ICD-10-CM

## 2023-05-13 DIAGNOSIS — R0789 Other chest pain: Secondary | ICD-10-CM

## 2023-05-13 DIAGNOSIS — R002 Palpitations: Secondary | ICD-10-CM

## 2023-05-13 MED ORDER — METOPROLOL SUCCINATE ER 25 MG PO TB24: 12.5000 mg | ORAL_TABLET | Freq: Every day | ORAL | 1 refills | Status: DC

## 2023-05-13 NOTE — Progress Notes (Addendum)
 Cardiology Office Note:  .   Date: 05/13/2023 ID:  Travis Evans, DOB 02-Jun-1960, MRN [id number] PCP: Daphney Eans, FNP  [facility] Providers Cardiologist:  Armida Lander, MD    History of Present Illness: Travis Evans   Travis Evans is a 63 y.o. male with a PMH of chest pain, chronic RBBB and history of left anterior fascicular block, former smoker, thyroid disease, OSA, pulmonary nodule, and GERD who presents today for 6-8 week follow-up.  Previous cardiovascular history of falls positive nuclear stress test in 2018.  Cardiac catheterization was negative for significant disease.  Last seen by Dr. Armida Lander on April 22, 2021.  He had noticed some recent chest pain symptoms.  Coronary CTA was arranged and revealed coronary calcium score 129 with evidence of small right middle lobe nodule.  Recently seen at [facility] ED.  He was complaining of diarrhea and upper abdominal pain for 2 days.  Also noticed syncopal episode while at work that morning.  He started to stand up, became hot and flushed and passed out into his chair.  Abdominal pain was noted to be intermittent, cramping and 3/10 on 0-10 pain scale.  Denied any aggravating or alleviating factors.  He believed that diarrhea was due to his weight loss drug.  Denying chest pain shortness of breath.  CT of abdomen/pelvis was normal.  CXR normal.  Was given Rx for Zofran .  03/24/2023 - Today presents for hospital follow-up.  He states his diarrhea started soon after starting Mounjaro. He confirms that he believes he was dehydrated surrounding time of event. Denies any chest pain, palpitations, syncope, presyncope, dizziness, orthopnea, PND, swelling or significant weight changes, acute bleeding, or claudication. Does admit to some shortness of breath with exertion noticed while walking for many years, sometimes seems to be worse over time.   Presents today for follow-up.  He tells me he went to the ER last night at [facility]  for palpitations.  I reviewed these records.  By the time patient arrived, palpitations had resolved, denied any chest pain.  Lab work unremarkable.  EKG unremarkable.  Recommended to follow-up with outpatient cardiology to consider ZIO monitor placement.  This is already been completed with preliminary report noted below-see below.  Says this occurred last night while at work and explains near syncopal episode along with his palpitations, said he felt lightheaded pale and was having chest pain.  He says these episodes have been ongoing over the past year, seem to be getting worse over time.  Also said during this episode he had numbness along his face, chest and both hands were tingling.  Today he admits to faint left-sided chest pressure, 1 out of 0-10 pain scale, this has been intermittent per his report.  Patient says surrounding this episode he was eating regular meals and staying well-hydrated.  Says this episode at work And without warning.  Typically pattern is these episodes have been happening while at work as he works the night shift.  He is tells me he is continuing to take Mounjaro, says he believes this is not an issue taking this medicine.  Wife also is concerned as she says he has been having some indigestion for the past few weeks, particularly mentions that when he belches there is a malodorous odor to this, says this is not typical. Has not seen GI in several years.   Outside records reviewed below of CT of head and neck that were faxed today by PCP's office, performed  with [facility].   ROS: Negative. See HPI.   Studies Reviewed: Travis Evans    EKG: EKG Interpretation Date/Time:  Wednesday May 13 2023 09:17:39 EDT Ventricular Rate:  66 PR Interval:  116 QRS Duration:  160 QT Interval:  448 QTC Calculation: 469 R Axis:   -73  Text Interpretation: Normal sinus rhythm Right bundle branch block Left anterior fascicular block Bifascicular block Septal infarct (cited on or  before 24-Mar-2023) When compared with ECG of 24-Mar-2023 10:47, Questionable change in initial forces of Septal leads Confirmed by Lasalle Pointer [phone]) on 05/13/2023 9:19:26 AM   CT of head and neck and bilateral carotid duplex (04/22/2023): Carotid duplex:  Impression:  No significant areas of carotid stenosis  CT of head (04/22/2023): Impression:  No acute intracranial process.  Chronic involutional changes.  Bilateral ethmoid sinus disease.   Cardiac monitor (preliminary report) 04/2023:  Patch Wear Time:  14 days and 0 hours (2025-03-11T11:36:59-0400 to 2025-03-25T11:36:59-0400)   Patient had a min HR of 50 bpm, max HR of 222 bpm, and avg HR of 79 bpm. Predominant underlying rhythm was Sinus Rhythm. Bundle Branch Block/IVCD was present. 37 Supraventricular Tachycardia runs occurred, the run with the fastest interval lasting 27.2  secs with a max rate of 222 bpm (avg 133 bpm); the run with the fastest interval was also the longest. Isolated SVEs were rare (<1.0%), SVE Couplets were rare (<1.0%), and SVE Triplets were rare (<1.0%). Isolated VEs were rare (<1.0%), VE Couplets were  rare (<1.0%), and no VE Triplets were present.  Echo 04/2023:  1. Left ventricular ejection fraction, by estimation, is 55 to 60%. The  left ventricle has normal function. The left ventricle has no regional  wall motion abnormalities. Left ventricular diastolic parameters are  consistent with Grade I diastolic  dysfunction (impaired relaxation).   2. Right ventricular systolic function is normal. The right ventricular  size is normal. Tricuspid regurgitation signal is inadequate for assessing  PA pressure.   3. The mitral valve is grossly normal. Trivial mitral valve  regurgitation.   4. The aortic valve is tricuspid. Aortic valve regurgitation is trivial.  No aortic stenosis is present. Aortic valve mean gradient measures 5.0  mmHg.   5. The inferior vena cava is dilated in size with >50% respiratory   variability, suggesting right atrial pressure of 8 mmHg.   Comparison(s): A prior study was performed on 06/04/2021. Prior images reviewed side by side. LVEF remains normal range at 55-60%.  Echo 05/2021:  1. Left ventricular ejection fraction, by estimation, is 60 to 65%. The  left ventricle has normal function. Left ventricular endocardial border  not optimally defined to evaluate regional wall motion. Left ventricular  diastolic parameters were normal.  The average left ventricular global longitudinal strain is -19.4 %. The  global longitudinal strain is normal.   2. Right ventricular systolic function is normal. The right ventricular  size is normal.   3. The mitral valve is normal in structure. Trivial mitral valve  regurgitation. No evidence of mitral stenosis.   4. The aortic valve has an indeterminant number of cusps. Aortic valve  regurgitation is not visualized. No aortic stenosis is present.   5. The inferior vena cava is normal in size with greater than 50%  respiratory variability, suggesting right atrial pressure of 3 mmHg.  CCTA 04/2021:  IMPRESSION: 1. Coronary calcium score of 129. This was 72 percentile for age and sex matched control.   2. Normal coronary origin with right dominance.  3. Mild RCA and LAD calcified plaque (0-24%). Non flow limiting. Mild left main calcified plaque (no stenosis).  IMPRESSION: Small RIGHT middle lobe nodule. No follow-up needed if patient is low-risk. Non-contrast chest CT can be considered in 12 months if patient is high-risk. This recommendation follows the consensus statement: Guidelines for Management of Incidental Pulmonary Nodules Detected on CT Images: From the Fleischner Society 2017; Radiology 2017; 284:228-243.  LHC 03/2016:  The left ventricular systolic function is normal. LV end diastolic pressure is normal.   Normal LV function.   Essentially normal coronary arteries with very minimal luminal irregularity of  the LAD without obstructive disease.   RECOMMENDATION: Medical therapy.  Consider evaluation for noncardiac etiology to the patient's chest pain vs small vessel disease. Suspect the nuclear study may be artifactual.   Physical Exam:   VS:  BP 122/74 (BP Location: Left Arm)   Pulse 73   Ht 5\' 10"  (1.778 m)   Wt 223 lb 6.4 oz (101.3 kg)   SpO2 97%   BMI 32.05 kg/m    Wt Readings from Last 3 Encounters:  05/13/23 223 lb 6.4 oz (101.3 kg)  03/24/23 232 lb (105.2 kg)  04/22/21 239 lb (108.4 kg)    GEN: Obese, 63 y.o. male in no acute distress NECK: No JVD; No carotid bruits CARDIAC: S1/S2, RRR, no murmurs, rubs, gallops RESPIRATORY:  Clear to auscultation without rales, wheezing or rhonchi  ABDOMEN: Soft, non-tender, non-distended EXTREMITIES:  No edema; No deformity   ASSESSMENT AND PLAN: .    Hx of syncope, near syncope, orthostatic dizziness, PSVT/palpitations Etiology in the past has mostly sounded vasovagal as he was noticing diarrhea/dehydration at a previous time. Denies any recurrent syncopal episodes since I last saw him.  Did have near syncopal episode at work late last night-prompted ED visit this morning. R/O for ACS. Recent labs unremarkable. Appears he has no warning signs prior to these episodes, develops palpitations, intermittent chest pain, lightheaded, and said he was pale.  Admits to dizziness when bending over and standing up.  Orthostatics today unremarkable, similar to last office visit.  Preliminary cardiac monitor report noted above.  Report did indicate some episodes of SVT with faster heart rates, seems that this may be triggering his symptoms as he notices a pattern while at work.  He has had a very reassuring cardiac workup -including past CCTA, echocardiogram, no carotid artery stenosis noted on duplex arranged by PCP, also CT imaging of the head revealed nothing acute.  Due to his symptoms of palpitations, will initiate metoprolol  succinate 12.5 mg daily.  Will  bring back in 1-2 weeks to repeat EKG.  No other medication changes at this time.  Care and ED precautions discussed.  Discussed/strongly advised to avoid driving for at least 6 months.  2. Atypical CP Felt to be nonischemic in nature, notes mild active CP today. EKG obtained today was negative for any acute ischemic changes.  Recent troponins checked at [facility] negative.  Reviewed past cardiac workup including cardiac catheterization in 2018 that showed essentially normal coronary arteries with very minimal luminal irregularity of LAD without obstructive disease.  Underwent CCTA in 2023 that was also reassuring that revealed coronary calcium score 129 with mild RCA and LAD calcified plaque, no significant stenosis noted.  Possibly GI related-patient mentions indigestion and wife is concerned about malodorous belching by patient, also experienced nausea with lying down, however CT of abdomen reviewed from previous hospitalization earlier this year that was unremarkable for anything acute.  Recommended to follow-up with PCP for further evaluation.  No medication changes at this time besides what is noted above.  Care and ED precautions discussed.  RBBB, LAFB Chronic and longstanding history of RBBB, LAFB on EKG.  Noted on EKG today.  Past cardiac workup above noted and unremarkable. No medication changes at this time.  Care and ED precautions discussed.  Obesity Weight loss via diet and exercise encouraged. Discussed the impact being overweight would have on cardiovascular risk.  Recommended that he discuss his Mounjaro to his PCP.   I spent a total duration of 60 minutes reviewing prior notes, reviewing outside records including (CT scan of head and neck, carotid duplex) labs, EKG today, face-to-face counseling of his medical condition, pathophysiology, evaluation, management, and documenting the findings in the note.   Dispo: Care and ED precautions discussed. Follow-up with me/APP in 4-6 weeks  or sooner anything changes.  Signed, Lasalle Pointer, NP

## 2023-05-13 NOTE — Patient Instructions (Addendum)
 Medication Instructions:  Your physician has recommended you make the following change in your medication:  Please Start Toprol  XL 12.5 Mg daily   Labwork: None   Testing/Procedures: None   Follow-Up: Your physician recommends that you schedule a follow-up appointment in: 1-2 weeks nurse visit for an EKG   4-6 weeks   Any Other Special Instructions Will Be Listed Below (If Applicable).  If you need a refill on your cardiac medications before your next appointment, please call your pharmacy.

## 2023-05-27 ENCOUNTER — Ambulatory Visit: Attending: Cardiology

## 2023-05-27 VITALS — BP 121/74 | HR 65 | Ht 70.0 in

## 2023-05-27 DIAGNOSIS — Z87898 Personal history of other specified conditions: Secondary | ICD-10-CM | POA: Diagnosis not present

## 2023-05-27 NOTE — Progress Notes (Signed)
 EKG looks similar compared to previous EKG. No med changes. Follow-up as scheduled.   Thanks!   Best, Lasalle Pointer, NP

## 2023-05-27 NOTE — Progress Notes (Signed)
 Nurse visit, EKG done  Patient reports less sensations of dizziness now  Will forward to E Pack,NP

## 2023-05-27 NOTE — Patient Instructions (Signed)
 Continue medications as directed, we will call you if E.Peck,NP has any new recommendations    Thank you for choosing [facility] !

## 2023-05-28 ENCOUNTER — Telehealth: Payer: Self-pay

## 2023-05-28 NOTE — Telephone Encounter (Signed)
 Left provider message for patient. He does not need to call back.

## 2023-05-28 NOTE — Telephone Encounter (Signed)
-----   Message from Lasalle Pointer sent at 05/27/2023  7:31 PM EDT -----  ----- Message ----- From: Annabel Barns, RN Sent: 05/27/2023   8:45 AM EDT To: Lasalle Pointer, NP

## 2023-06-01 DIAGNOSIS — R55 Syncope and collapse: Secondary | ICD-10-CM

## 2023-06-17 ENCOUNTER — Ambulatory Visit: Payer: Self-pay | Admitting: Nurse Practitioner

## 2023-06-25 ENCOUNTER — Encounter: Payer: Self-pay | Admitting: Nurse Practitioner

## 2023-06-25 ENCOUNTER — Ambulatory Visit: Attending: Nurse Practitioner | Admitting: Nurse Practitioner

## 2023-06-25 VITALS — BP 100/64 | HR 72 | Ht 70.0 in | Wt 219.2 lb

## 2023-06-25 DIAGNOSIS — R42 Dizziness and giddiness: Secondary | ICD-10-CM | POA: Diagnosis not present

## 2023-06-25 DIAGNOSIS — Z87898 Personal history of other specified conditions: Secondary | ICD-10-CM

## 2023-06-25 DIAGNOSIS — R911 Solitary pulmonary nodule: Secondary | ICD-10-CM

## 2023-06-25 DIAGNOSIS — R55 Syncope and collapse: Secondary | ICD-10-CM

## 2023-06-25 DIAGNOSIS — E669 Obesity, unspecified: Secondary | ICD-10-CM

## 2023-06-25 DIAGNOSIS — R079 Chest pain, unspecified: Secondary | ICD-10-CM | POA: Diagnosis not present

## 2023-06-25 DIAGNOSIS — I471 Supraventricular tachycardia, unspecified: Secondary | ICD-10-CM

## 2023-06-25 DIAGNOSIS — I4510 Unspecified right bundle-branch block: Secondary | ICD-10-CM

## 2023-06-25 DIAGNOSIS — I444 Left anterior fascicular block: Secondary | ICD-10-CM

## 2023-06-25 DIAGNOSIS — R0789 Other chest pain: Secondary | ICD-10-CM

## 2023-06-25 DIAGNOSIS — R002 Palpitations: Secondary | ICD-10-CM | POA: Diagnosis not present

## 2023-06-25 NOTE — Progress Notes (Unsigned)
 Cardiology Office Note:  .   Date: 06/25/2023 ID:  Travis Evans, DOB 1960/11/15, MRN [id number] PCP: Daphney Eans, FNP  [facility] Providers Cardiologist:  Armida Lander, MD    History of Present Illness: Travis Evans   Travis Evans is a 63 y.o. male with a PMH of chest pain, chronic RBBB and history of left anterior fascicular block, former smoker, thyroid disease, OSA, pulmonary nodule, and GERD who presents today for 4-6 week follow-up.  Previous cardiovascular history of falls positive nuclear stress test in 2018.  Cardiac catheterization was negative for significant disease.  Last seen by Dr. Armida Lander on April 22, 2021.  He had noticed some recent chest pain symptoms.  Coronary CTA was arranged and revealed coronary calcium score 129 with evidence of small right middle lobe nodule.  Recently seen at [facility] ED.  He was complaining of diarrhea and upper abdominal pain for 2 days.  Also noticed syncopal episode while at work that morning.  He started to stand up, became hot and flushed and passed out into his chair.  Abdominal pain was noted to be intermittent, cramping and 3/10 on 0-10 pain scale.  Denied any aggravating or alleviating factors.  He believed that diarrhea was due to his weight loss drug.  Denying chest pain shortness of breath.  CT of abdomen/pelvis was normal.  CXR normal.  Was given Rx for Zofran .  03/24/2023 - Today presents for hospital follow-up.  He states his diarrhea started soon after starting Mounjaro. He confirms that he believes he was dehydrated surrounding time of event. Denies any chest pain, palpitations, syncope, presyncope, dizziness, orthopnea, PND, swelling or significant weight changes, acute bleeding, or claudication. Does admit to some shortness of breath with exertion noticed while walking for many years, sometimes seems to be worse over time.   05/13/2023 - Presents today for follow-up.  He tells me he went to the ER last night at  [facility] for palpitations.  I reviewed these records.  By the time patient arrived, palpitations had resolved, denied any chest pain.  Lab work unremarkable.  EKG unremarkable.  Recommended to follow-up with outpatient cardiology to consider ZIO monitor placement.  This is already been completed with preliminary report noted below-see below.  Says this occurred last night while at work and explains near syncopal episode along with his palpitations, said he felt lightheaded pale and was having chest pain.  He says these episodes have been ongoing over the past year, seem to be getting worse over time.  Also said during this episode he had numbness along his face, chest and both hands were tingling.  Today he admits to faint left-sided chest pressure, 1 out of 0-10 pain scale, this has been intermittent per his report.  Patient says surrounding this episode he was eating regular meals and staying well-hydrated.  Says this episode at work And without warning.  Typically pattern is these episodes have been happening while at work as he works the night shift.  He is tells me he is continuing to take Mounjaro, says he believes this is not an issue taking this medicine.  Wife also is concerned as she says he has been having some indigestion for the past few weeks, particularly mentions that when he belches there is a malodorous odor to this, says this is not typical. Has not seen GI in several years.   Outside records reviewed below of CT of head and neck that were faxed today by PCP's  office, performed with [facility].   06/25/2023 - Here for follow-up. Chief concern is dizziness that he continues to notice. Denies any syncopal episodes. Believes his dizzy spells are slightly better since last office visit. Continues to note occasional chest pressure, similar to last office visit, unsure if this is improved. He is following a pulmonary provider who is monitoring his lung nodule. Denies any  shortness of breath, palpitations, syncope, presyncope, orthopnea, PND, swelling or significant weight changes, acute bleeding, or claudication.  ROS: Negative. See HPI.   Studies Reviewed: Travis Evans    EKG: EKG Interpretation Date/Time:  Thursday June 25 2023 09:05:55 EDT Ventricular Rate:  71 PR Interval:  144 QRS Duration:  160 QT Interval:  426 QTC Calculation: 462 R Axis:   -70  Text Interpretation: Sinus rhythm with Premature supraventricular complexes Left axis deviation Right bundle branch block When compared with ECG of 27-May-2023 08:38, Premature supraventricular complexes are now Present Criteria for Septal infarct are no longer Present T wave inversion now evident in Inferior leads Confirmed by Lasalle Pointer [phone]) on 06/26/2023 4:27:43 PM   Cardiac monitor 05/2023:    14 day monitor   Rare supraventricular ectopy in the form of isolated PACs, couplets, triplets. 37 runs of SVT longest 27 seconds.   Rare ventricular ectopy in the form of isolated PVCs, couplets   No symptoms reported     Patch Wear Time:  14 days and 0 hours (2025-03-11T11:36:59-0400 to 2025-03-25T11:36:59-0400)   Patient had a min HR of 50 bpm, max HR of 222 bpm, and avg HR of 79 bpm. Predominant underlying rhythm was Sinus Rhythm. Bundle Branch Block/IVCD was present. 37 Supraventricular Tachycardia runs occurred, the run with the fastest interval lasting 27.2  secs with a max rate of 222 bpm (avg 133 bpm); the run with the fastest interval was also the longest. Isolated SVEs were rare (<1.0%), SVE Couplets were rare (<1.0%), and SVE Triplets were rare (<1.0%). Isolated VEs were rare (<1.0%), VE Couplets were  rare (<1.0%), and no VE Triplets were present.  CT of head and neck and bilateral carotid duplex (04/22/2023): Carotid duplex:  Impression:  No significant areas of carotid stenosis  CT of head (04/22/2023): Impression:  No acute intracranial process.  Chronic involutional changes.  Bilateral ethmoid  sinus disease.   Cardiac monitor (preliminary report) 04/2023:  Patch Wear Time:  14 days and 0 hours (2025-03-11T11:36:59-0400 to 2025-03-25T11:36:59-0400)   Patient had a min HR of 50 bpm, max HR of 222 bpm, and avg HR of 79 bpm. Predominant underlying rhythm was Sinus Rhythm. Bundle Branch Block/IVCD was present. 37 Supraventricular Tachycardia runs occurred, the run with the fastest interval lasting 27.2  secs with a max rate of 222 bpm (avg 133 bpm); the run with the fastest interval was also the longest. Isolated SVEs were rare (<1.0%), SVE Couplets were rare (<1.0%), and SVE Triplets were rare (<1.0%). Isolated VEs were rare (<1.0%), VE Couplets were  rare (<1.0%), and no VE Triplets were present.  Echo 04/2023:  1. Left ventricular ejection fraction, by estimation, is 55 to 60%. The  left ventricle has normal function. The left ventricle has no regional  wall motion abnormalities. Left ventricular diastolic parameters are  consistent with Grade I diastolic  dysfunction (impaired relaxation).   2. Right ventricular systolic function is normal. The right ventricular  size is normal. Tricuspid regurgitation signal is inadequate for assessing  PA pressure.   3. The mitral valve is grossly normal. Trivial mitral valve  regurgitation.   4. The aortic valve is tricuspid. Aortic valve regurgitation is trivial.  No aortic stenosis is present. Aortic valve mean gradient measures 5.0  mmHg.   5. The inferior vena cava is dilated in size with >50% respiratory  variability, suggesting right atrial pressure of 8 mmHg.   Comparison(s): A prior study was performed on 06/04/2021. Prior images reviewed side by side. LVEF remains normal range at 55-60%.  Echo 05/2021:  1. Left ventricular ejection fraction, by estimation, is 60 to 65%. The  left ventricle has normal function. Left ventricular endocardial border  not optimally defined to evaluate regional wall motion. Left ventricular  diastolic  parameters were normal.  The average left ventricular global longitudinal strain is -19.4 %. The  global longitudinal strain is normal.   2. Right ventricular systolic function is normal. The right ventricular  size is normal.   3. The mitral valve is normal in structure. Trivial mitral valve  regurgitation. No evidence of mitral stenosis.   4. The aortic valve has an indeterminant number of cusps. Aortic valve  regurgitation is not visualized. No aortic stenosis is present.   5. The inferior vena cava is normal in size with greater than 50%  respiratory variability, suggesting right atrial pressure of 3 mmHg.  CCTA 04/2021:  IMPRESSION: 1. Coronary calcium score of 129. This was 72 percentile for age and sex matched control.   2. Normal coronary origin with right dominance.   3. Mild RCA and LAD calcified plaque (0-24%). Non flow limiting. Mild left main calcified plaque (no stenosis).  IMPRESSION: Small RIGHT middle lobe nodule. No follow-up needed if patient is low-risk. Non-contrast chest CT can be considered in 12 months if patient is high-risk. This recommendation follows the consensus statement: Guidelines for Management of Incidental Pulmonary Nodules Detected on CT Images: From the Fleischner Society 2017; Radiology 2017; 284:228-243.  LHC 03/2016:  The left ventricular systolic function is normal. LV end diastolic pressure is normal.   Normal LV function.   Essentially normal coronary arteries with very minimal luminal irregularity of the LAD without obstructive disease.   RECOMMENDATION: Medical therapy.  Consider evaluation for noncardiac etiology to the patient's chest pain vs small vessel disease. Suspect the nuclear study may be artifactual.   Physical Exam:   VS:  BP 100/64 (BP Location: Left Arm, Patient Position: Sitting, Cuff Size: Large)   Pulse 72   Ht 5' 10 (1.778 m)   Wt 219 lb 3.2 oz (99.4 kg)   SpO2 95%   BMI 31.45 kg/m    Wt Readings from Last  3 Encounters:  06/25/23 219 lb 3.2 oz (99.4 kg)  05/13/23 223 lb 6.4 oz (101.3 kg)  03/24/23 232 lb (105.2 kg)    GEN: Obese, 63 y.o. male in no acute distress NECK: No JVD; No carotid bruits CARDIAC: S1/S2, RRR, no murmurs, rubs, gallops RESPIRATORY:  Clear to auscultation without rales, wheezing or rhonchi  ABDOMEN: Soft, non-tender, non-distended EXTREMITIES:  No edema; No deformity   ASSESSMENT AND PLAN: .    Hx of syncope, near syncope, dizziness, PSVT/palpitations Etiology in the past has mostly sounded vasovagal as he was noticing diarrhea/dehydration at a previous time. Denies any recurrent syncopal episodes since I last saw him.  In the past, did have near syncopal episode at work - prompted ED visit  R/O for ACS. Recent labs unremarkable. Admits to dizziness when bending over and standing up.  Orthostatics unremarkable. Cardiac monitor report noted above.  He has had  a very reassuring cardiac workup -including past CCTA, echocardiogram, no carotid artery stenosis noted on duplex arranged by PCP, also CT imaging of the head revealed nothing acute. No room to titrate GDMT - some of episodes of dizziness sounds like possible vertigo. Recommend PCP evaluation to r/o non cardiac causes. No other medication changes at this time.  Care and ED precautions discussed.  Discussed/strongly advised to avoid driving for at least 6 months.  2. Chest pressure Felt to be nonischemic in nature, notes chest pressure today. EKG obtained today was negative for any acute ischemic changes.  Reviewed past cardiac workup including cardiac catheterization in 2018 that showed essentially normal coronary arteries with very minimal luminal irregularity of LAD without obstructive disease.  Underwent CCTA in 2023 that was also reassuring that revealed coronary calcium score 129 with mild RCA and LAD calcified plaque, no significant stenosis noted.  Possibly GI related-patient has mentioned indigestion. Recommended to  follow-up with PCP for further evaluation.  No medication changes at this time besides what is noted above.  Care and ED precautions discussed.  RBBB, LAFB Chronic and longstanding history of RBBB, LAFB on EKG.  Past cardiac workup above noted and unremarkable. No medication changes at this time.  Care and ED precautions discussed.  Obesity Weight loss via diet and exercise encouraged. Discussed the impact being overweight would have on cardiovascular risk.  Recommended that he discuss his Mounjaro to his PCP.   4. Pulmonary nodule Following provider locally who is monitoring this per his report. Continue to f/u with PCP and Pulmonology.     Dispo: Care and ED precautions discussed. Follow-up with me/[facility] in 4-6 weeks or sooner anything changes.  Signed, Lasalle Pointer, NP

## 2023-06-25 NOTE — Patient Instructions (Addendum)
 Medication Instructions:  Your physician recommends that you continue on your current medications as directed. Please refer to the Current Medication list given to you today.  Labwork: None   Testing/Procedures: None   Follow-Up: Your physician recommends that you schedule a follow-up appointment in: 4-6 Weeks   Any Other Special Instructions Will Be Listed Below (If Applicable).  If you need a refill on your cardiac medications before your next appointment, please call your pharmacy.

## 2023-09-02 ENCOUNTER — Encounter: Payer: Self-pay | Admitting: *Deleted

## 2023-09-02 ENCOUNTER — Encounter: Payer: Self-pay | Admitting: Cardiology

## 2023-09-02 ENCOUNTER — Ambulatory Visit: Attending: Cardiology | Admitting: Cardiology

## 2023-09-02 VITALS — BP 122/77 | HR 72 | Ht 71.0 in | Wt 211.0 lb

## 2023-09-02 DIAGNOSIS — R0789 Other chest pain: Secondary | ICD-10-CM | POA: Diagnosis not present

## 2023-09-02 DIAGNOSIS — R002 Palpitations: Secondary | ICD-10-CM

## 2023-09-02 DIAGNOSIS — R42 Dizziness and giddiness: Secondary | ICD-10-CM

## 2023-09-02 DIAGNOSIS — Z87898 Personal history of other specified conditions: Secondary | ICD-10-CM | POA: Diagnosis not present

## 2023-09-02 NOTE — Progress Notes (Signed)
 Clinical Summary Mr. Obeirne is a 63 y.o.male seen today for follow up of the following medical problems.   1. Chest pain  - episode of chest pain. Multiple episodes. Can occur at rest or with exertion. Left sided chest pressure, radiates down left arm. Lasts just a few minutes. Some increase in frequency, stable severity - admitted to [facility] 03/2016 with chest pain - Lexiscan showed inferior ischemia, intermediate risk   - 03/2016 cath without significant disease CAD risk factors: 2 brothers with CAD, former smoker x 42 years.     04/2022 coronary CTA: calcium score 129, mild RCA and LAD plaque 04/2023 echo: LVEF 55-60%, no WMAs, grade I dd, normal RV  - rare symptoms   2.Syncope - benign extensive prior workup, thought possibly vasovagal. Occurred in setting of abdominal pain, diarrhea. Occurred after standing up, felt hot and flushed and passed out.  - 04/2023 monitor: 14 day monitor, rare PACs, 37 runs of SVT longest 27 seconds. Rare PVCs. No symptoms reported - last episode about 2 months ago - working to stay well hydrated. Works in [employer] both at his job and at [place] his [place].    3.Palpitations - ER visit 04/2023 with palpitations.  - 04/2023 monitor: 14 day monitor, rare PACs, 37 runs of SVT longest 27 seconds. Rare PVCs. No symptoms reported  -symptoms improved - pcp stopped toprol  due to low bp's - cut back on caffeine, used to drink 4-6 cups of coffee   4. Dizziness - symptoms improved meclizine added by his pcp per patient report     SH Prentice Heller Senior is his father, a patient of mine Works at [employer],       Past Medical History:  Diagnosis Date   Abnormal EKG    hx of right bundle Jaxxen Voong block and left anterior fasicular block had cardiac cath 2018 normal results   Complication of anesthesia    difficulty waking x 1   DJD (degenerative joint disease)    left hip   Full dentures    Gastroesophageal reflux    Lower back injury     Peptic ulcer    in 2006, thought to be due to medications per patient?    Sleep apnea    wears CPAP set on 8   Thyroid disease      Allergies  Allergen Reactions   Hibiclens  [Chlorhexidine ] Itching and Rash     Current Outpatient Medications  Medication Sig Dispense Refill   albuterol (PROVENTIL) (2.5 MG/3ML) 0.083% nebulizer solution Take 2.5 mg by nebulization.     albuterol (VENTOLIN HFA) 108 (90 Base) MCG/ACT inhaler Inhale 2 puffs into the lungs.     Ascorbic Acid (VITAMIN C) 1000 MG tablet Take 1,000 mg by mouth daily.     azelastine  (ASTELIN ) 0.1 % nasal spray Place 1 spray into both nostrils daily as needed for allergies. Use in each nostril as directed     B Complex-C (SUPER B COMPLEX PO) Take 1 capsule by mouth daily at 12 noon.      Cholecalciferol (VITAMIN D3) 125 MCG (5000 UT) TABS Take 5,000 Units by mouth daily at 12 noon.      EPINEPHrine  0.3 mg/0.3 mL IJ SOAJ injection Inject 0.3 mg into the muscle once as needed (allergic reaction).     loratadine  (CLARITIN ) 10 MG tablet Take 10 mg by mouth at bedtime.      metoprolol  succinate (TOPROL  XL) 25 MG 24 hr tablet Take  0.5 tablets (12.5 mg total) by mouth daily. 45 tablet 1   pantoprazole  (PROTONIX ) 40 MG tablet Take 40 mg by mouth at bedtime.      tadalafil (CIALIS) 5 MG tablet Take 5 mg by mouth daily.     tirzepatide (MOUNJARO) 2.5 MG/0.5ML Pen Inject 2.5 mg into the skin once a week.     traZODone (DESYREL) 50 MG tablet Take 50 mg by mouth daily.      VITAMIN E PO Take by mouth daily.     White Petrolatum -Mineral Oil (LUBRICANT EYE NIGHTTIME) OINT Place 1 application into the right eye at bedtime.     Zinc 30 MG CAPS Take 30 mg by mouth daily at 12 noon. (Patient not taking: Reported on 06/25/2023)     No current facility-administered medications for this visit.     Past Surgical History:  Procedure Laterality Date   BIOPSY  09/09/2019   Procedure: BIOPSY;  Surgeon: Cindie Carlin POUR, DO;  Location: [facility];  Service: Endoscopy;;   CARPAL TUNNEL RELEASE     COLONOSCOPY WITH ESOPHAGOGASTRODUODENOSCOPY (EGD)     COLONOSCOPY WITH PROPOFOL  N/A 09/09/2019   non-bleeding internal hemorrhoids, sigmoid diverticula, sessile 2 mm polyp in transverse colon and 2 mm sessile rectal polyp (Tubular adenoma and hyperplastic). Surveillance in 2028   CYST REMOVAL NECK     ESOPHAGOGASTRODUODENOSCOPY (EGD) WITH PROPOFOL  N/A 09/09/2019   gastritis, normal duodenum, negative H.pylori, negtiave celiac sprue   EYE SURGERY     cornea surgery   GIVENS CAPSULE STUDY N/A 09/30/2019   possible scattered lymphangiectasias, no obviou erosions or ulcerations, distal small bowel slightly erythematous   JOINT REPLACEMENT     LEFT HEART CATH AND CORONARY ANGIOGRAPHY N/A 04/07/2016   Procedure: Left Heart Cath and Coronary Angiography;  Surgeon: Debby DELENA Sor, MD;  Location: [facility] INVASIVE CV LAB;  Service: Cardiovascular;  Laterality: N/A;   MULTIPLE TOOTH EXTRACTIONS     TONSILLECTOMY     TOTAL HIP ARTHROPLASTY Left 02/03/2017   Procedure: TOTAL HIP ARTHROPLASTY ANTERIOR APPROACH;  Surgeon: Sheril Coy, MD;  Location: [facility] OR;  Service: Orthopedics;  Laterality: Left;   TOTAL HIP ARTHROPLASTY Right 08/10/2018   Procedure: TOTAL HIP ARTHROPLASTY ANTERIOR APPROACH;  Surgeon: Sheril Coy, MD;  Location: [facility] ORS;  Service: Orthopedics;  Laterality: Right;   UMBILICAL HERNIA REPAIR       Allergies  Allergen Reactions   Hibiclens  [Chlorhexidine ] Itching and Rash      Family History  Problem Relation Age of Onset   Pancreatic cancer Mother    Stroke Father    Pancreatic cancer Nephew 37   Colon cancer Neg Hx      Social History Mr. Bracey reports that he quit smoking about 11 years ago. His smoking use included cigarettes. He has quit using smokeless tobacco.  His smokeless tobacco use included chew. Mr. Maiden reports current alcohol use.    Physical Examination Today's Vitals   09/02/23 0812  BP:  122/77  Pulse: 72  SpO2: 98%  Weight: 211 lb (95.7 kg)  Height: 5' 11 (1.803 m)   Body mass index is 29.43 kg/m.  Gen: resting comfortably, no acute distress HEENT: no scleral icterus, pupils equal round and reactive, no palptable cervical adenopathy,  CV: RRR, no mrg, no jvd Resp: Clear to auscultation bilaterally GI: abdomen is soft, non-tender, non-distended, normal bowel sounds, no hepatosplenomegaly MSK: extremities are warm, no edema.  Skin: warm, no rash Neuro:  no focal deficits Psych: appropriate affect  Diagnostic Studies 03/2016 cath The left ventricular systolic function is normal. LV end diastolic pressure is normal.   Normal LV function.   Essentially normal coronary arteries with very minimal luminal irregularity of the LAD without obstructive disease.   RECOMMENDATION: Medical therapy.  Consider evaluation for noncardiac etiology to the patient's chest pain vs small vessel disease. Suspect the nuclear study may be artifactual.   04/2021 coronary CTA IMPRESSION: 1. Coronary calcium score of 129. This was 72 percentile for age and sex matched control.   2. Normal coronary origin with right dominance.   3. Mild RCA and LAD calcified plaque (0-24%). Non flow limiting. Mild left main calcified plaque (no stenosis).    Assessment and Plan    1.Chest pain - prior symptoms, cardiac testing has been benign including coronary CTA last year - rare symptoms, monitor at this time. Would consider GI referral if recurrence.    2. Syncope - no recent episodes, continue aggressive hydration  3. Dizziness - improved with meclizine per patient report, continue to monitor  4. Palpitations - denies recent symptoms. He reports pcp stopped toprol  due to low bp's - monitor at this time - EKG today shows SR, PACs, RBBB/LAFB  F/u 6 months    Dorn PHEBE Ross, M.D.

## 2023-09-02 NOTE — Patient Instructions (Signed)
 Medication Instructions:  Continue all current medications.   Labwork: none  Testing/Procedures: none  Follow-Up: 6 months   Any Other Special Instructions Will Be Listed Below (If Applicable).   If you need a refill on your cardiac medications before your next appointment, please call your pharmacy.

## 2023-09-04 ENCOUNTER — Encounter: Payer: Self-pay | Admitting: Cardiology
